# Patient Record
Sex: Female | Born: 1970 | Race: White | Hispanic: No | Marital: Married | Smoking: Never smoker
Health system: Southern US, Community
[De-identification: ages and names within clinical notes are randomized; demographics above are authoritative.]

## PROBLEM LIST (undated history)

## (undated) DIAGNOSIS — R519 Headache, unspecified: Secondary | ICD-10-CM

## (undated) DIAGNOSIS — G40909 Epilepsy, unspecified, not intractable, without status epilepticus: Secondary | ICD-10-CM

## (undated) HISTORY — PX: TOE SURGERY: SHX1073

## (undated) HISTORY — PX: APPENDECTOMY: SHX54

## (undated) HISTORY — PX: MOLE REMOVAL: SHX2046

## (undated) HISTORY — PX: ABDOMINAL HYSTERECTOMY: SHX81

## (undated) HISTORY — PX: BREAST BIOPSY: SHX20

---

## 2000-01-11 ENCOUNTER — Encounter: Payer: Self-pay | Admitting: Emergency Medicine

## 2000-01-11 ENCOUNTER — Emergency Department (HOSPITAL_COMMUNITY): Admission: EM | Admit: 2000-01-11 | Discharge: 2000-01-11 | Payer: Self-pay | Admitting: Emergency Medicine

## 2000-03-13 ENCOUNTER — Other Ambulatory Visit: Admission: RE | Admit: 2000-03-13 | Discharge: 2000-03-13 | Payer: Self-pay | Admitting: *Deleted

## 2000-03-13 ENCOUNTER — Encounter: Admission: RE | Admit: 2000-03-13 | Discharge: 2000-03-13 | Payer: Self-pay | Admitting: *Deleted

## 2000-03-13 ENCOUNTER — Encounter: Payer: Self-pay | Admitting: *Deleted

## 2005-01-15 ENCOUNTER — Observation Stay (HOSPITAL_COMMUNITY): Admission: AD | Admit: 2005-01-15 | Discharge: 2005-01-16 | Payer: Self-pay | Admitting: Gynecology

## 2007-07-06 ENCOUNTER — Ambulatory Visit: Payer: Self-pay | Admitting: Family Medicine

## 2007-08-22 ENCOUNTER — Ambulatory Visit: Payer: Self-pay | Admitting: Family Medicine

## 2009-05-07 ENCOUNTER — Emergency Department: Payer: Self-pay | Admitting: Emergency Medicine

## 2009-05-12 DIAGNOSIS — Z09 Encounter for follow-up examination after completed treatment for conditions other than malignant neoplasm: Secondary | ICD-10-CM | POA: Insufficient documentation

## 2009-05-19 DIAGNOSIS — Z0000 Encounter for general adult medical examination without abnormal findings: Secondary | ICD-10-CM | POA: Insufficient documentation

## 2010-03-25 ENCOUNTER — Emergency Department: Payer: Self-pay | Admitting: Emergency Medicine

## 2011-09-19 DIAGNOSIS — G40219 Localization-related (focal) (partial) symptomatic epilepsy and epileptic syndromes with complex partial seizures, intractable, without status epilepticus: Secondary | ICD-10-CM | POA: Insufficient documentation

## 2013-01-11 DIAGNOSIS — R928 Other abnormal and inconclusive findings on diagnostic imaging of breast: Secondary | ICD-10-CM | POA: Insufficient documentation

## 2014-07-24 DIAGNOSIS — D251 Intramural leiomyoma of uterus: Secondary | ICD-10-CM | POA: Insufficient documentation

## 2017-10-07 DIAGNOSIS — R030 Elevated blood-pressure reading, without diagnosis of hypertension: Secondary | ICD-10-CM | POA: Insufficient documentation

## 2018-07-15 ENCOUNTER — Other Ambulatory Visit: Payer: Self-pay | Admitting: Student

## 2018-07-15 ENCOUNTER — Other Ambulatory Visit (HOSPITAL_COMMUNITY): Payer: Self-pay | Admitting: Student

## 2018-07-15 DIAGNOSIS — G8929 Other chronic pain: Secondary | ICD-10-CM

## 2018-07-15 DIAGNOSIS — M25562 Pain in left knee: Secondary | ICD-10-CM

## 2018-07-17 ENCOUNTER — Ambulatory Visit
Admission: RE | Admit: 2018-07-17 | Discharge: 2018-07-17 | Disposition: A | Discharge status: Home / Self Care | Payer: BC Managed Care – PPO | Source: Ambulatory Visit | Attending: Student | Admitting: Student

## 2018-07-17 ENCOUNTER — Other Ambulatory Visit: Payer: Self-pay

## 2018-07-17 DIAGNOSIS — M25562 Pain in left knee: Secondary | ICD-10-CM | POA: Insufficient documentation

## 2018-07-17 DIAGNOSIS — G8929 Other chronic pain: Secondary | ICD-10-CM | POA: Diagnosis present

## 2018-11-05 ENCOUNTER — Other Ambulatory Visit: Payer: Self-pay

## 2018-11-05 ENCOUNTER — Encounter
Admission: RE | Admit: 2018-11-05 | Discharge: 2018-11-05 | Disposition: A | Discharge status: Home / Self Care | Payer: BC Managed Care – PPO | Source: Ambulatory Visit | Attending: Obstetrics & Gynecology | Admitting: Obstetrics & Gynecology

## 2018-11-05 HISTORY — DX: Epilepsy, unspecified, not intractable, without status epilepticus: G40.909

## 2018-11-05 NOTE — Patient Instructions (Addendum)
Your procedure is scheduled on: Friday 11/13/18.  Report to DAY SURGERY DEPARTMENT LOCATED ON 2ND FLOOR MEDICAL MALL ENTRANCE. To find out your arrival time please call [phone] between 1PM - 3PM on Thursday 11/12/18.    Remember: Instructions that are not followed completely may result in serious medical risk, up to and including death, or upon the discretion of your surgeon and anesthesiologist your surgery may need to be rescheduled.       _X__ 1. Do not eat food after midnight the night before your procedure.                 No gum chewing or hard candies. You may drink clear liquids up to 2 hours                 before you are scheduled to arrive for your surgery- DO NOT drink clear                 liquids within 2 hours of the start of your surgery.                 Clear Liquids include:  water, apple juice without pulp, clear carbohydrate                 drink such as Clearfast or Gatorade, Black Coffee or Tea (Do not add                 milk or creamer to coffee or tea).   **Dr. Leonides Schanz would like for you to complete the Ensure Pre-Surgery drink 2 hours prior to your arrival time on the morning of your surgery.**     __X__2.  On the morning of surgery brush your teeth with toothpaste and water, you may rinse your mouth with mouthwash if you wish.  Do not swallow any toothpaste or mouthwash.      __X__3.  Notify your doctor if there is any change in your medical condition      (cold, fever, infections).       Do not wear jewelry, make-up, hairpins, clips or nail polish. Do not wear lotions, powders, or perfumes.  Do not shave 48 hours prior to surgery. Men may shave face and neck. Do not bring valuables to the hospital.      [facility] is not responsible for any belongings or valuables.    Contacts, dentures/partials or body piercings may not be worn into surgery. Bring a case for your contacts, glasses or hearing aids, a denture cup will be  supplied.     Patients discharged the day of surgery will not be allowed to drive home.     Please read over the following fact sheets that you were given:   MRSA Information    __X__ Take these medicines the morning of surgery with A SIP OF WATER:     1. amitriptyline (ELAVIL) 25 MG tablet  2. CARBAMAZEPINE ER PO  3. levETIRAcetam (KEPPRA) 1000 MG tablet     __X__ Use CHG Soap as directed    __X__ Stop Anti-inflammatories 7 days before surgery such as Advil, Ibuprofen, Motrin, BC or Goodies Powder, Naprosyn, Naproxen, Aleve, Aspirin, Meloxicam. May take Tylenol if needed for pain or discomfort.     __X__ Don't start taking any new herbal supplements before surgery.

## 2018-11-10 ENCOUNTER — Other Ambulatory Visit
Admission: RE | Admit: 2018-11-10 | Discharge: 2018-11-10 | Disposition: A | Discharge status: Home / Self Care | Payer: BC Managed Care – PPO | Source: Ambulatory Visit | Attending: Obstetrics & Gynecology | Admitting: Obstetrics & Gynecology

## 2018-11-10 ENCOUNTER — Other Ambulatory Visit: Payer: Self-pay

## 2018-11-10 DIAGNOSIS — Z20828 Contact with and (suspected) exposure to other viral communicable diseases: Secondary | ICD-10-CM | POA: Diagnosis not present

## 2018-11-10 DIAGNOSIS — Z01812 Encounter for preprocedural laboratory examination: Secondary | ICD-10-CM | POA: Diagnosis not present

## 2018-11-10 LAB — TYPE AND SCREEN
ABO/RH(D): A POS
Antibody Screen: NEGATIVE

## 2018-11-10 LAB — CBC
HCT: 38.9 % (ref 36.0–46.0)
Hemoglobin: 13 g/dL (ref 12.0–15.0)
MCH: 33 pg (ref 26.0–34.0)
MCHC: 33.4 g/dL (ref 30.0–36.0)
MCV: 98.7 fL (ref 80.0–100.0)
Platelets: 223 10*3/uL (ref 150–400)
RBC: 3.94 MIL/uL (ref 3.87–5.11)
RDW: 12.9 % (ref 11.5–15.5)
WBC: 3.2 10*3/uL — ABNORMAL LOW (ref 4.0–10.5)
nRBC: 0 % (ref 0.0–0.2)

## 2018-11-10 LAB — BASIC METABOLIC PANEL
Anion gap: 8 (ref 5–15)
BUN: 9 mg/dL (ref 6–20)
CO2: 24 mmol/L (ref 22–32)
Calcium: 8.9 mg/dL (ref 8.9–10.3)
Chloride: 107 mmol/L (ref 98–111)
Creatinine, Ser: 0.55 mg/dL (ref 0.44–1.00)
GFR calc Af Amer: >60 mL/min (ref >60)
GFR calc non Af Amer: >60 mL/min (ref >60)
Glucose, Bld: 97 mg/dL (ref 70–99)
Potassium: 4.4 mmol/L (ref 3.5–5.1)
Sodium: 139 mmol/L (ref 135–145)

## 2018-11-10 LAB — SARS CORONAVIRUS 2 (TAT 6-24 HRS): SARS Coronavirus 2: NEGATIVE

## 2018-11-13 ENCOUNTER — Ambulatory Visit: Payer: BC Managed Care – PPO | Admitting: Anesthesiology

## 2018-11-13 ENCOUNTER — Ambulatory Visit
Admission: RE | Admit: 2018-11-13 | Discharge: 2018-11-13 | Disposition: A | Discharge status: Home / Self Care | Payer: BC Managed Care – PPO | Attending: Obstetrics & Gynecology | Admitting: Obstetrics & Gynecology

## 2018-11-13 ENCOUNTER — Encounter
Admission: RE | Disposition: A | Discharge status: Home / Self Care | Payer: Self-pay | Source: Home / Self Care | Attending: Obstetrics & Gynecology

## 2018-11-13 ENCOUNTER — Other Ambulatory Visit: Payer: Self-pay

## 2018-11-13 ENCOUNTER — Encounter: Payer: Self-pay | Admitting: *Deleted

## 2018-11-13 DIAGNOSIS — D259 Leiomyoma of uterus, unspecified: Secondary | ICD-10-CM | POA: Insufficient documentation

## 2018-11-13 DIAGNOSIS — K660 Peritoneal adhesions (postprocedural) (postinfection): Secondary | ICD-10-CM | POA: Insufficient documentation

## 2018-11-13 DIAGNOSIS — G40909 Epilepsy, unspecified, not intractable, without status epilepticus: Secondary | ICD-10-CM | POA: Insufficient documentation

## 2018-11-13 DIAGNOSIS — N841 Polyp of cervix uteri: Secondary | ICD-10-CM | POA: Insufficient documentation

## 2018-11-13 DIAGNOSIS — N800 Endometriosis of uterus: Secondary | ICD-10-CM | POA: Diagnosis not present

## 2018-11-13 DIAGNOSIS — N946 Dysmenorrhea, unspecified: Secondary | ICD-10-CM | POA: Diagnosis not present

## 2018-11-13 DIAGNOSIS — R102 Pelvic and perineal pain: Secondary | ICD-10-CM | POA: Diagnosis not present

## 2018-11-13 DIAGNOSIS — Z79899 Other long term (current) drug therapy: Secondary | ICD-10-CM | POA: Diagnosis not present

## 2018-11-13 HISTORY — PX: ROBOTIC ASSISTED TOTAL HYSTERECTOMY WITH BILATERAL SALPINGO OOPHERECTOMY: SHX6086

## 2018-11-13 HISTORY — PX: LAPAROSCOPIC LYSIS OF ADHESIONS: SHX5905

## 2018-11-13 LAB — POCT PREGNANCY, URINE: Preg Test, Ur: NEGATIVE

## 2018-11-13 LAB — ABO/RH: ABO/RH(D): A POS

## 2018-11-13 SURGERY — HYSTERECTOMY, TOTAL, ROBOT-ASSISTED, LAPAROSCOPIC, WITH BILATERAL SALPINGO-OOPHORECTOMY
Anesthesia: General | Laterality: Bilateral

## 2018-11-13 MED ORDER — SODIUM CHLORIDE FLUSH 0.9 % IV SOLN
INTRAVENOUS | Status: AC
  Filled 2018-11-13: qty 10

## 2018-11-13 MED ORDER — DEXAMETHASONE SODIUM PHOSPHATE 10 MG/ML IJ SOLN
INTRAMUSCULAR | Status: AC
  Administered 2018-11-13: 07:00:00 4 mg via INTRAVENOUS
  Filled 2018-11-13: qty 1

## 2018-11-13 MED ORDER — FENTANYL CITRATE (PF) 100 MCG/2ML IJ SOLN
INTRAMUSCULAR | Status: AC
  Administered 2018-11-13: 15:00:00 25 ug via INTRAVENOUS
  Filled 2018-11-13: qty 2

## 2018-11-13 MED ORDER — DEXAMETHASONE SODIUM PHOSPHATE 10 MG/ML IJ SOLN
INTRAMUSCULAR | Status: AC
  Filled 2018-11-13: qty 1

## 2018-11-13 MED ORDER — ACETAMINOPHEN 500 MG PO TABS
1000.0000 mg | ORAL_TABLET | ORAL | Status: AC
  Administered 2018-11-13: 07:00:00 1000 mg via ORAL

## 2018-11-13 MED ORDER — PHENYLEPHRINE HCL (PRESSORS) 10 MG/ML IV SOLN
INTRAVENOUS | Status: AC
  Filled 2018-11-13: qty 1

## 2018-11-13 MED ORDER — ROCURONIUM BROMIDE 100 MG/10ML IV SOLN
INTRAVENOUS | Status: DC | PRN
  Administered 2018-11-13 (×3): 10 mg via INTRAVENOUS
  Administered 2018-11-13: 15 mg via INTRAVENOUS
  Administered 2018-11-13 (×2): 10 mg via INTRAVENOUS
  Administered 2018-11-13: 50 mg via INTRAVENOUS
  Administered 2018-11-13 (×2): 10 mg via INTRAVENOUS

## 2018-11-13 MED ORDER — DEXAMETHASONE SODIUM PHOSPHATE 10 MG/ML IJ SOLN
INTRAMUSCULAR | Status: DC | PRN
  Administered 2018-11-13: 5 mg via INTRAVENOUS

## 2018-11-13 MED ORDER — IBUPROFEN 800 MG PO TABS: 800.0000 mg | ORAL_TABLET | Freq: Four times a day (QID) | ORAL | 0 refills | Status: DC

## 2018-11-13 MED ORDER — OXYCODONE HCL 5 MG PO TABS
ORAL_TABLET | ORAL | Status: AC
  Filled 2018-11-13: qty 1

## 2018-11-13 MED ORDER — HEPARIN SODIUM (PORCINE) 5000 UNIT/ML IJ SOLN
5000.0000 [IU] | INTRAMUSCULAR | Status: AC
  Administered 2018-11-13: 07:00:00 5000 [IU] via SUBCUTANEOUS

## 2018-11-13 MED ORDER — SUGAMMADEX SODIUM 200 MG/2ML IV SOLN
INTRAVENOUS | Status: AC
  Filled 2018-11-13: qty 2

## 2018-11-13 MED ORDER — GLYCOPYRROLATE 0.2 MG/ML IJ SOLN
INTRAMUSCULAR | Status: AC
  Filled 2018-11-13: qty 1

## 2018-11-13 MED ORDER — MIDAZOLAM HCL 2 MG/2ML IJ SOLN
INTRAMUSCULAR | Status: AC
  Filled 2018-11-13: qty 2

## 2018-11-13 MED ORDER — ROCURONIUM BROMIDE 50 MG/5ML IV SOLN
INTRAVENOUS | Status: AC
  Filled 2018-11-13: qty 1

## 2018-11-13 MED ORDER — FENTANYL CITRATE (PF) 100 MCG/2ML IJ SOLN
25.0000 ug | INTRAMUSCULAR | Status: DC | PRN
  Administered 2018-11-13 (×4): 25 ug via INTRAVENOUS

## 2018-11-13 MED ORDER — PROPOFOL 10 MG/ML IV BOLUS
INTRAVENOUS | Status: DC | PRN
  Administered 2018-11-13: 150 mg via INTRAVENOUS

## 2018-11-13 MED ORDER — OXYCODONE HCL 5 MG/5ML PO SOLN: 5.0000 mg | Freq: Once | ORAL | Status: AC | PRN

## 2018-11-13 MED ORDER — SCOPOLAMINE 1 MG/3DAYS TD PT72
MEDICATED_PATCH | TRANSDERMAL | Status: AC
  Administered 2018-11-13: 1.5 mg via TRANSDERMAL
  Filled 2018-11-13: qty 1

## 2018-11-13 MED ORDER — SEVOFLURANE IN SOLN
RESPIRATORY_TRACT | Status: AC
  Filled 2018-11-13: qty 250

## 2018-11-13 MED ORDER — PHENYLEPHRINE HCL (PRESSORS) 10 MG/ML IV SOLN
INTRAVENOUS | Status: DC | PRN
  Administered 2018-11-13: 100 ug via INTRAVENOUS
  Administered 2018-11-13: 150 ug via INTRAVENOUS
  Administered 2018-11-13 (×3): 100 ug via INTRAVENOUS

## 2018-11-13 MED ORDER — FAMOTIDINE 20 MG PO TABS
20.0000 mg | ORAL_TABLET | Freq: Once | ORAL | Status: AC
  Administered 2018-11-13: 07:00:00 20 mg via ORAL

## 2018-11-13 MED ORDER — EPHEDRINE SULFATE 50 MG/ML IJ SOLN
INTRAMUSCULAR | Status: AC
  Filled 2018-11-13: qty 1

## 2018-11-13 MED ORDER — KETOROLAC TROMETHAMINE 30 MG/ML IJ SOLN
INTRAMUSCULAR | Status: DC | PRN
  Administered 2018-11-13: 15 mg via INTRAVENOUS

## 2018-11-13 MED ORDER — PROPOFOL 10 MG/ML IV BOLUS
INTRAVENOUS | Status: AC
  Filled 2018-11-13: qty 20

## 2018-11-13 MED ORDER — CELECOXIB 200 MG PO CAPS
ORAL_CAPSULE | ORAL | Status: AC
  Administered 2018-11-13: 07:00:00 400 mg via ORAL
  Filled 2018-11-13: qty 2

## 2018-11-13 MED ORDER — FENTANYL CITRATE (PF) 100 MCG/2ML IJ SOLN
INTRAMUSCULAR | Status: DC | PRN
  Administered 2018-11-13 (×2): 25 ug via INTRAVENOUS
  Administered 2018-11-13: 100 ug via INTRAVENOUS
  Administered 2018-11-13 (×4): 25 ug via INTRAVENOUS

## 2018-11-13 MED ORDER — ENSURE PRE-SURGERY PO LIQD
296.0000 mL | Freq: Once | ORAL | Status: DC
  Filled 2018-11-13: qty 296

## 2018-11-13 MED ORDER — OXYCODONE HCL 5 MG PO TABS: 5.0000 mg | ORAL_TABLET | ORAL | 0 refills | Status: AC | PRN

## 2018-11-13 MED ORDER — ONDANSETRON HCL 4 MG/2ML IJ SOLN
INTRAMUSCULAR | Status: DC | PRN
  Administered 2018-11-13: 4 mg via INTRAVENOUS

## 2018-11-13 MED ORDER — ATROPINE SULFATE 0.4 MG/ML IJ SOLN
INTRAMUSCULAR | Status: AC
  Filled 2018-11-13: qty 1

## 2018-11-13 MED ORDER — ACETAMINOPHEN 500 MG PO TABS
ORAL_TABLET | ORAL | Status: AC
  Administered 2018-11-13: 1000 mg via ORAL
  Filled 2018-11-13: qty 2

## 2018-11-13 MED ORDER — LIDOCAINE HCL (CARDIAC) PF 100 MG/5ML IV SOSY
PREFILLED_SYRINGE | INTRAVENOUS | Status: DC | PRN
  Administered 2018-11-13: 100 mg via INTRAVENOUS

## 2018-11-13 MED ORDER — LIDOCAINE HCL (PF) 2 % IJ SOLN
INTRAMUSCULAR | Status: AC
  Filled 2018-11-13: qty 10

## 2018-11-13 MED ORDER — MIDAZOLAM HCL 2 MG/2ML IJ SOLN
INTRAMUSCULAR | Status: DC | PRN
  Administered 2018-11-13: 2 mg via INTRAVENOUS

## 2018-11-13 MED ORDER — HEPARIN SODIUM (PORCINE) 5000 UNIT/ML IJ SOLN
INTRAMUSCULAR | Status: AC
  Administered 2018-11-13: 5000 [IU] via SUBCUTANEOUS
  Filled 2018-11-13: qty 1

## 2018-11-13 MED ORDER — SUGAMMADEX SODIUM 200 MG/2ML IV SOLN
INTRAVENOUS | Status: DC | PRN
  Administered 2018-11-13: 100 mg via INTRAVENOUS

## 2018-11-13 MED ORDER — OXYCODONE HCL 5 MG PO TABS
5.0000 mg | ORAL_TABLET | Freq: Once | ORAL | Status: AC | PRN
  Administered 2018-11-13: 5 mg via ORAL

## 2018-11-13 MED ORDER — CEFAZOLIN SODIUM-DEXTROSE 2-4 GM/100ML-% IV SOLN
INTRAVENOUS | Status: AC
  Filled 2018-11-13: qty 100

## 2018-11-13 MED ORDER — CEFAZOLIN SODIUM-DEXTROSE 2-4 GM/100ML-% IV SOLN
2.0000 g | INTRAVENOUS | Status: AC
  Administered 2018-11-13 (×2): 2 g via INTRAVENOUS

## 2018-11-13 MED ORDER — CELECOXIB 200 MG PO CAPS
400.0000 mg | ORAL_CAPSULE | ORAL | Status: AC
  Administered 2018-11-13: 07:00:00 400 mg via ORAL

## 2018-11-13 MED ORDER — ONDANSETRON HCL 4 MG/2ML IJ SOLN
INTRAMUSCULAR | Status: AC
  Filled 2018-11-13: qty 2

## 2018-11-13 MED ORDER — LACTATED RINGERS IV SOLN
INTRAVENOUS | Status: DC
  Administered 2018-11-13: 07:00:00 via INTRAVENOUS

## 2018-11-13 MED ORDER — SUCCINYLCHOLINE CHLORIDE 20 MG/ML IJ SOLN
INTRAMUSCULAR | Status: AC
  Filled 2018-11-13: qty 1

## 2018-11-13 MED ORDER — FAMOTIDINE 20 MG PO TABS
ORAL_TABLET | ORAL | Status: AC
  Administered 2018-11-13: 20 mg via ORAL
  Filled 2018-11-13: qty 1

## 2018-11-13 MED ORDER — DEXAMETHASONE SODIUM PHOSPHATE 10 MG/ML IJ SOLN
4.0000 mg | INTRAMUSCULAR | Status: AC
  Administered 2018-11-13: 07:00:00 4 mg via INTRAVENOUS

## 2018-11-13 MED ORDER — SCOPOLAMINE 1 MG/3DAYS TD PT72
1.0000 | MEDICATED_PATCH | TRANSDERMAL | Status: DC
  Administered 2018-11-13: 07:00:00 1.5 mg via TRANSDERMAL

## 2018-11-13 MED ORDER — GABAPENTIN 300 MG PO CAPS
ORAL_CAPSULE | ORAL | Status: AC
  Administered 2018-11-13: 600 mg via ORAL
  Filled 2018-11-13: qty 2

## 2018-11-13 MED ORDER — FENTANYL CITRATE (PF) 250 MCG/5ML IJ SOLN
INTRAMUSCULAR | Status: AC
  Filled 2018-11-13: qty 5

## 2018-11-13 MED ORDER — GABAPENTIN 300 MG PO CAPS
600.0000 mg | ORAL_CAPSULE | ORAL | Status: AC
  Administered 2018-11-13: 07:00:00 600 mg via ORAL

## 2018-11-13 MED ORDER — GLYCOPYRROLATE 0.2 MG/ML IJ SOLN
INTRAMUSCULAR | Status: DC | PRN
  Administered 2018-11-13: 0.2 mg via INTRAVENOUS

## 2018-11-13 SURGICAL SUPPLY — 86 items
BAG URINE DRAINAGE (UROLOGICAL SUPPLIES) ×4 IMPLANT
BARRIER ADHS 3X4 INTERCEED (GAUZE/BANDAGES/DRESSINGS) ×6 IMPLANT
BLADE SURG SZ11 CARB STEEL (BLADE) ×4 IMPLANT
CANISTER SUCT 1200ML W/VALVE (MISCELLANEOUS) ×4 IMPLANT
CATH FOLEY 2WAY  5CC 16FR (CATHETERS) ×2
CATH URTH 16FR FL 2W BLN LF (CATHETERS) ×2 IMPLANT
CHLORAPREP W/TINT 26 (MISCELLANEOUS) ×4 IMPLANT
CORD BIP STRL DISP 12FT (MISCELLANEOUS) IMPLANT
COVER TIP SHEARS 8 DVNC (MISCELLANEOUS) ×2 IMPLANT
COVER TIP SHEARS 8MM DA VINCI (MISCELLANEOUS) ×2
COVER WAND RF STERILE (DRAPES) ×4 IMPLANT
CRADLE LAMINECT ARM (MISCELLANEOUS) ×4 IMPLANT
DEFOGGER SCOPE WARMER CLEARIFY (MISCELLANEOUS) ×4 IMPLANT
DERMABOND ADVANCED (GAUZE/BANDAGES/DRESSINGS) ×2
DERMABOND ADVANCED .7 DNX12 (GAUZE/BANDAGES/DRESSINGS) ×2 IMPLANT
DRAPE 3/4 80X56 (DRAPES) ×12 IMPLANT
DRAPE ARM DVNC X/XI (DISPOSABLE) ×8 IMPLANT
DRAPE COLUMN DVNC XI (DISPOSABLE) ×2 IMPLANT
DRAPE DA VINCI XI ARM (DISPOSABLE) ×8
DRAPE DA VINCI XI COLUMN (DISPOSABLE) ×2
DRAPE LEGGINS SURG 28X43 STRL (DRAPES) ×4 IMPLANT
DRAPE UNDER BUTTOCK W/FLU (DRAPES) ×4 IMPLANT
DRIVER NDL 23- D MEGA 49.7X1.4 (INSTRUMENTS) ×2 IMPLANT
DRIVER NDL LRG 8 DVNC XI (INSTRUMENTS) ×2 IMPLANT
DRIVER NDLE LRG 8 DVNC XI (INSTRUMENTS) IMPLANT
DRIVER NEEDLE XI 8MM LRG DVNC (INSTRUMENTS)
DRVR NDL 23- D MEGA 49.7X1.4 (INSTRUMENTS) ×2
ELECT REM PT RETURN 9FT ADLT (ELECTROSURGICAL) ×4
ELECTRODE REM PT RTRN 9FT ADLT (ELECTROSURGICAL) ×2 IMPLANT
FILTER LAP SMOKE EVAC STRL (MISCELLANEOUS) ×4 IMPLANT
GLOVE PROTEXIS LATEX SZ 7.5 (GLOVE) ×28 IMPLANT
GLOVE SURG LATEX 7.5 PF (GLOVE) IMPLANT
GLOVE SURG SYN 6.5 ES PF (GLOVE) ×32 IMPLANT
GLOVE SURG SYN 6.5 PF PI (GLOVE) ×16 IMPLANT
GOWN STRL REUS W/ TWL LRG LVL3 (GOWN DISPOSABLE) ×16 IMPLANT
GOWN STRL REUS W/TWL LRG LVL3 (GOWN DISPOSABLE) ×16
GRASPER SUT TROCAR 14GX15 (MISCELLANEOUS) ×2 IMPLANT
IRRIGATION STRYKERFLOW (MISCELLANEOUS) IMPLANT
IRRIGATOR STRYKERFLOW (MISCELLANEOUS)
IV NS 1000ML (IV SOLUTION)
IV NS 1000ML BAXH (IV SOLUTION) IMPLANT
KIT PINK PAD W/HEAD ARE REST (MISCELLANEOUS) ×4
KIT PINK PAD W/HEAD ARM REST (MISCELLANEOUS) ×2 IMPLANT
KIT TURNOVER CYSTO (KITS) ×4 IMPLANT
L-HOOK LAP DISP 36CM (ELECTROSURGICAL) ×4
LABEL OR SOLS (LABEL) ×4 IMPLANT
LHOOK LAP DISP 36CM (ELECTROSURGICAL) IMPLANT
MANIPULATOR VCARE LG CRV RETR (MISCELLANEOUS) IMPLANT
MANIPULATOR VCARE SML CRV RETR (MISCELLANEOUS) IMPLANT
MANIPULATOR VCARE STD CRV RETR (MISCELLANEOUS) ×2 IMPLANT
NDL INSUFF 14G 150MM VS150000 (NEEDLE) ×2 IMPLANT
NEEDLE HYPO 22GX1.5 SAFETY (NEEDLE) ×4 IMPLANT
NEEDLE VERESS 14GA 120MM (NEEDLE) ×2 IMPLANT
NS IRRIG 1000ML POUR BTL (IV SOLUTION) ×4 IMPLANT
OBTURATOR OPTICAL STANDARD 8MM (TROCAR) ×2
OBTURATOR OPTICAL STND 8 DVNC (TROCAR) ×2
OBTURATOR OPTICALSTD 8 DVNC (TROCAR) ×2 IMPLANT
PACK LAP CHOLECYSTECTOMY (MISCELLANEOUS) ×4 IMPLANT
PAD OB MATERNITY 4.3X12.25 (PERSONAL CARE ITEMS) ×4 IMPLANT
PAD PREP 24X41 OB/GYN DISP (PERSONAL CARE ITEMS) ×4 IMPLANT
PENCIL ELECTRO HAND CTR (MISCELLANEOUS) ×4 IMPLANT
SEAL CANN UNIV 5-8 DVNC XI (MISCELLANEOUS) ×6 IMPLANT
SEAL XI 5MM-8MM UNIVERSAL (MISCELLANEOUS) ×6
SEALER VESSEL DA VINCI XI (MISCELLANEOUS) ×2
SEALER VESSEL EXT DVNC XI (MISCELLANEOUS) IMPLANT
SET CYSTO W/LG BORE CLAMP LF (SET/KITS/TRAYS/PACK) IMPLANT
SOLUTION ELECTROLUBE (MISCELLANEOUS) ×4 IMPLANT
SUT CUT NEEDLE DRIVER (INSTRUMENTS) ×2
SUT DVC VLOC 180 0 12IN GS21 (SUTURE)
SUT ETHIBOND NAB CT1 #1 30IN (SUTURE) IMPLANT
SUT MNCRL 4-0 (SUTURE) ×2
SUT MNCRL 4-0 27XMFL (SUTURE) ×2
SUT VIC AB 0 CT1 36 (SUTURE) ×6 IMPLANT
SUT VIC AB 0 UR5 27 (SUTURE) ×4 IMPLANT
SUT VIC AB 1 CT1 36 (SUTURE) ×6 IMPLANT
SUT VIC AB 2-0 CT1 27 (SUTURE)
SUT VIC AB 2-0 CT1 TAPERPNT 27 (SUTURE) ×2 IMPLANT
SUT VIC AB 3-0 SH 27 (SUTURE)
SUT VIC AB 3-0 SH 27X BRD (SUTURE) ×4 IMPLANT
SUT VICRYL 0 AB UR-6 (SUTURE) ×4 IMPLANT
SUTURE DVC VLC 180 0 12IN GS21 (SUTURE) IMPLANT
SUTURE MNCRL 4-0 27XMF (SUTURE) ×2 IMPLANT
SYR 10ML LL (SYRINGE) ×4 IMPLANT
TROCAR ENDO BLADELESS 11MM (ENDOMECHANICALS) ×2 IMPLANT
TROCAR XCEL NON-BLD 5MMX100MML (ENDOMECHANICALS) ×2 IMPLANT
TUBING EVAC SMOKE HEATED PNEUM (TUBING) ×2 IMPLANT

## 2018-11-13 NOTE — OR Nursing (Signed)
Spoke with Dr Leonides Schanz regarding patients complaints of pressure in rectum. Complaints of pressure explained by Dr Leonides Schanz and passed on to patient. All questions answered.

## 2018-11-13 NOTE — Anesthesia Procedure Notes (Signed)
Procedure Name: Intubation Date/Time: 11/13/2018 7:47 AM Performed by: Zetta Bills, CRNA Pre-anesthesia Checklist: Patient identified, Emergency Drugs available, Suction available and Patient being monitored Patient Re-evaluated:Patient Re-evaluated prior to induction Oxygen Delivery Method: Circle system utilized Preoxygenation: Pre-oxygenation with 100% oxygen Induction Type: IV induction Ventilation: Mask ventilation without difficulty Laryngoscope Size: Mac and 3 Grade View: Grade I Tube type: Oral Tube size: 7.0 mm Number of attempts: 1 Airway Equipment and Method: Stylet Placement Confirmation: ETT inserted through vocal cords under direct vision,  positive ETCO2 and breath sounds checked- equal and bilateral Secured at: 20 cm Tube secured with: Tape Dental Injury: Teeth and Oropharynx as per pre-operative assessment

## 2018-11-13 NOTE — Anesthesia Post-op Follow-up Note (Signed)
Anesthesia QCDR form completed.        

## 2018-11-13 NOTE — Op Note (Addendum)
11/13/2018  PATIENT:  Carolyn Cuevas  48 y.o. female  PRE-OPERATIVE DIAGNOSIS:  Dysmenorrhea, pelvic pain  POST-OPERATIVE DIAGNOSIS:  Dysmenorrhea, pelvic pain, STAGE 4 Endometriosis, extensive pelvic and abdominal adhesive disease.  PROCEDURE:  Procedure(s): XI ROBOTIC ASSISTED TYPE 5 RADICAL HYSTERECTOMY WITH BILATERAL SALPINGECTOMY EXTENSIVE LAPAROSCOPIC LYSIS OF ADHESIONS, PERITONEAL STRIPPING  SURGEON:  Surgeon(s) and Role:    * Ward, Chelsea C, MD - Primary  ANESTHESIA: GET  EBL:  Total I/O In: 1800 [I.V.:1800] Out: 660 [Urine:600; Blood:60]  DRAINS: foley to gravity, removed in OR postop  SPECIMEN: Uterus, Cervix, bilateral tubes  DISPOSITION OF SPECIMEN:  To pathology  COUNTS: correct x2  COMPLICATIONS: none apparent  PATIENT DISPOSITION:  VS stable to PACU  FINDINGS: 1. 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  2. Filmy and dense adhesions across ascending and descending colon, altering transit course and mobility of these portions of the colon, as well as obscuring areas for designated port site entry. 3. Obliterated posterior culdesac 4. Several hemosiderin brown and black endometriosis deposits.  Some endometriomas created by scarred peritoneum (inclusion cyst) in both ovarian fossas, and other areas posterior pelvis 5. Bilateral tubes scarred to ovary and ovarian fossa  6. Dilated pelvic vessels on right 7. Normal bilateral ovaries 8. Patent rectum, prior to and post release of scar tissue between rectum/sigmoid and uterus in the posterior culdesac.  Indication for surgery: Patient had presented with complaints of dysmenorrhea, dysparenunia.  Various treatment options were discussed and patient requested a hysterectomy to resolve her symptoms. Risks benefits and alternatives were reviewed and informed consent was obtained.   Procedure: The patient was brought to the OR and identified as Carolyn Cuevas.  She was given general  anesthesia via endotracheal route.  Nasogastric tube was placed.  She was then positioned in the dorsal lithotomy position and prepped and draped in the usual sterile fashion.  A surgical time-out was called. A foley catheter was placed.  A speculum was placed in the vagina and the cervix was visualized, grasped with a single tooth tenaculum and the V-Care uterine manipulator was placed in and around the cervix.  After a change of gloves, the attention was turned to the abdomen.  A midline incision was made in the umbilicus.  The subcutaneous tissues were dissected, the fascia was divided, the peritoneum entered, and a robotic trochar was inserted.  Pneumoperitoneum was created to 61mmHg.  One Robotic trochar was inserted and bovie hook was used to dissect away the descending bowel on the left side so the remaining two trochars could be inserted atraumatically under visualization.  The patient was placed in steep trendelenburg, and the daVinci robot was docked and a cautery hook, bipolar fenestrated grasper, and vessel sealer were employed.  A brief survey of the upper abdomen was performed. Due to the extensive scarring of the cecum and surrounding small and large bowel, at least an hour of dissection was spent in this area, which was necessary to visualize the surgical field.  Due to the extensive scarring in the pelvis, a retroperitoneal approach was necessary.  The entire procedure was then performed as a radical dissection. The bilateral round ligaments were cauterized and transected.  The anterior peritoneum was covered in brown hemosiderin deposits, and thus the peritoneum was stripped between the bilateral round ligaments, and anteriorly to the dome of the bladder.  Each fallopian tube was grasped and mesosalpinx divided along the ovaries, and the utero-ovarian ligaments/vessels were divided.  Each adnexa was reflected  medially and the ureters were visualized, the internal iliac arteries were identified  and the uterine branches skeletonized at their origin.  These were traced to the intersection of the ureter, and ureterolysis was performed bilaterally  to ensure that the vessels integrity was maintained.  The uterine arteries were both cauterized and transected laterally to their crossing of the ureters. These vessels were traced over the ureters and to the uterus. The uterus was anteverted and the dense scarring of the posterior culdesac was attempted to be divided.  Carefully from either lateral side the tissues were divided until what remained appeared to be bowel serosa.  A rectal sizer was placed into the rectum in order to visualized the path of the bowel.  Using this manipulator, the remainder of the bowel was freed by careful dissection of the posterior uterus.  Once mobile, the cardinal and uterosacral ligaments were divided and a colpotomy was created around the cervical cup.  The uterus, cervix, tubes and ovaries were removed through the vagina and handed off to nursing to be sent to pathology.  The instruments were changed to needle drivers, and the vaginal cuff was closed using 0-V-lock suture.  The cuff was tested for integrity.    The surgical field was covered in intercede hopefully to discourage scar formation.    The laparoscopic instruments were removed. The remaining trochars were removed and the midline fascia was closed using 0-Vicryl.  The skin of all incision were closed with 4-0 monocryl and covered with surgical glue.  The procedure was then deemed complete.    The sponge, needle, and instrument counts were correct x2.  The patient tolerated reversal of anesthesia, and was brought to the PACU in a stable condition.  I was present and performed this case in its entirety. Larey Days, MD Attending Obstetrician and Gynecologist [facility]  This case took 6.5 hours to complete.  Her scarring was extensive, and required careful  dissection.  Modifier 22.

## 2018-11-13 NOTE — Anesthesia Preprocedure Evaluation (Signed)
Anesthesia Evaluation  Patient identified by MRN, date of birth, ID band Patient awake    Reviewed: Allergy & Precautions, H&P , NPO status , Patient's Chart, lab work & pertinent test results  History of Anesthesia Complications Negative for: history of anesthetic complications  Airway Mallampati: III  TM Distance: >3 FB Neck ROM: full    Dental  (+) Chipped   Pulmonary neg pulmonary ROS, neg shortness of breath,           Cardiovascular Exercise Tolerance: Good (-) angina(-) Past MI and (-) DOE negative cardio ROS       Neuro/Psych Seizures -, Well Controlled,  negative psych ROS   GI/Hepatic negative GI ROS, Neg liver ROS, neg GERD  ,  Endo/Other  negative endocrine ROS  Renal/GU      Musculoskeletal   Abdominal   Peds  Hematology negative hematology ROS (+)   Anesthesia Other Findings Past Medical History: No date: Epilepsy [facility])  Past Surgical History: No date: APPENDECTOMY No date: TOE SURGERY; Right  BMI    Body Mass Index: 26.60 kg/m      Reproductive/Obstetrics negative OB ROS                             Anesthesia Physical Anesthesia Plan  ASA: III  Anesthesia Plan: General ETT   Post-op Pain Management:    Induction: Intravenous  PONV Risk Score and Plan: Ondansetron, Dexamethasone, Midazolam and Treatment may vary due to age or medical condition  Airway Management Planned: Oral ETT  Additional Equipment:   Intra-op Plan:   Post-operative Plan: Extubation in OR  Informed Consent: I have reviewed the patients History and Physical, chart, labs and discussed the procedure including the risks, benefits and alternatives for the proposed anesthesia with the patient or authorized representative who has indicated his/her understanding and acceptance.     Dental Advisory Given  Plan Discussed with: Anesthesiologist, CRNA and Surgeon  Anesthesia Plan  Comments: (Patient consented for risks of anesthesia including but not limited to:  - adverse reactions to medications - damage to teeth, lips or other oral mucosa - sore throat or hoarseness - Damage to heart, brain, lungs or loss of life  Patient voiced understanding.)        Anesthesia Quick Evaluation

## 2018-11-13 NOTE — Discharge Instructions (Signed)
Discharge instructions:  Call office if you have any of the following: fever >101 F, chills, shortness of breath, excessive vaginal bleeding, incision drainage or problems, leg pain or redness, or any other concerns.   Activity: Do not lift > 10 lbs for 8 weeks.  No intercourse or tampons for 8 weeks.  No driving for 1-2 weeks.   You may feel some pain in your upper right abdomen/rib and right shoulder.  This is from the gas in the abdomen for surgery. This will subside over time, please be patient!  Take 800mg  Ibuprofen and 1000mg  Tylenol -together- around the clock, every 6 hours for at least the first 3-5 days.  After this you can take as needed.  This will help decrease inflammation and promote healing.  The narcotics you'll take just as needed, as they just trick your brain into thinking its not in pain.    Please don't limit yourself in terms of routine activity.  You will be able to do most things, although they may take longer to do or be a little painful.  You can do it!  Don't be a hero, but don't be a wimp either!   AMBULATORY SURGERY  DISCHARGE INSTRUCTIONS   1) The drugs that you were given will stay in your system until tomorrow so for the next 24 hours you should not:  A) Drive an automobile B) Make any legal decisions C) Drink any alcoholic beverage   2) You may resume regular meals tomorrow.  Today it is better to start with liquids and gradually work up to solid foods.  You may eat anything you prefer, but it is better to start with liquids, then soup and crackers, and gradually work up to solid foods.   3) Please notify your doctor immediately if you have any unusual bleeding, trouble breathing, redness and pain at the surgery site, drainage, fever, or pain not relieved by medication.    4) Additional Instructions:        Please contact your physician with any problems or Same Day Surgery at [phone], Monday through Friday 6 am to 4 pm, or [place]  at [facility] number at [phone].

## 2018-11-13 NOTE — Transfer of Care (Signed)
Immediate Anesthesia Transfer of Care Note  Patient: Carolyn Cuevas  Procedure(s) Performed: XI ROBOTIC ASSISTED STAGE 5 RADICAL HYSTERECTOMY WITH BILATERAL SALPINGECTOMY (Bilateral ) EXTENSIVE LAPAROSCOPIC LYSIS OF ADHESIONS, PERITONEAL STRIPPING  Patient Location: PACU  Anesthesia Type:General  Level of Consciousness: awake  Airway & Oxygen Therapy: Patient Spontanous Breathing  Post-op Assessment: Report given to RN  Post vital signs: stable  Last Vitals:  Vitals Value Taken Time  BP 129/72 11/13/18 1423  Temp    Pulse 96 11/13/18 1428  Resp 21 11/13/18 1428  SpO2 100 % 11/13/18 1428  Vitals shown include unvalidated device data.  Last Pain:  Vitals:   11/13/18 0702  TempSrc: Tympanic  PainSc: 0-No pain      Patients Stated Pain Goal: 0 ([phone] [id number])  Complications: No apparent anesthesia complications

## 2018-11-13 NOTE — H&P (Addendum)
Preoperative History and Physical  Carolyn Cuevas is a 48 y.o. here for surgical management of dysmenorrhea.   No significant preoperative concerns.  TVUS: 09/08/2018 Uterus: 7 x 6 x 6cm, 122mL vol 2cm and 3cm intramural fibroids  EE: 8.37mm LO: 2 x 2 x 2cm RO 3 x 2 x 2cm, 2x 1.5cm cysts   Proposed surgery: robotic assisted total laparoscopic hysterectomy and bilateral salpingectomy  Past Medical History:  Diagnosis Date  . Epilepsy [facility])    Past Surgical History:  Procedure Laterality Date  . APPENDECTOMY    . TOE SURGERY Right    OB History  No obstetric history on file.  Patient denies any other pertinent gynecologic issues.   No current facility-administered medications on file prior to encounter.    Current Outpatient Medications on File Prior to Encounter  Medication Sig Dispense Refill  . amitriptyline (ELAVIL) 25 MG tablet Take 25 mg by mouth at bedtime.    Marland Kitchen CARBAMAZEPINE ER PO Take 600-800 mg by mouth See admin instructions. Take 600 mg by mouth in the morning and 800 mg at night    . Cholecalciferol (VITAMIN D) 125 MCG (5000 UT) CAPS Take 5,000 Units by mouth 2 (two) times daily.    . folic acid (FOLVITE) A999333 MCG tablet Take 1,200 mcg by mouth daily.    Marland Kitchen levETIRAcetam (KEPPRA) 1000 MG tablet Take 1,500 mg by mouth 2 (two) times daily.     Allergies  Allergen Reactions  . Iohexol Hives and Rash         Social History:   reports that she has never smoked. She has never used smokeless tobacco. She reports previous alcohol use. She reports that she does not use drugs.  History reviewed. No pertinent family history.  Review of Systems: Noncontributory  PHYSICAL EXAM: Blood pressure 132/90, pulse 78, temperature 97.8 F (36.6 C), temperature source Tympanic, resp. rate 20, height 5\' 5"  (1.651 m), weight 72.5 kg, last menstrual period 11/07/2018, SpO2 99 %. General appearance - alert, well appearing, and in no distress Chest - clear to auscultation, no  wheezes, rales or rhonchi, symmetric air entry Heart - normal rate and regular rhythm Abdomen - soft, nontender, nondistended, no masses or organomegaly Pelvic - examination not indicated Extremities - peripheral pulses normal, no pedal edema, no clubbing or cyanosis  Labs: Results for orders placed or performed during the hospital encounter of 11/13/18 (from the past 336 hour(s))  Pregnancy, urine POC   Collection Time: 11/13/18  6:21 AM  Result Value Ref Range   Preg Test, Ur NEGATIVE NEGATIVE  ABO/Rh   Collection Time: 11/13/18  6:31 AM  Result Value Ref Range   ABO/RH(D)      A POS Performed at [facility], [address] [place], [place] [address]   Results for orders placed or performed during the hospital encounter of 11/10/18 (from the past 336 hour(s))  Type and screen [place]   Collection Time: 11/10/18  8:06 AM  Result Value Ref Range   ABO/RH(D) A POS    Antibody Screen NEG    Sample Expiration 11/24/2018,2359    Extend sample reason      NO TRANSFUSIONS OR PREGNANCY IN THE PAST 3 MONTHS Performed at [facility], [address] [place], [place] [id number]   Basic metabolic panel   Collection Time: 11/10/18  8:09 AM  Result Value Ref Range   Sodium 139 135 - 145 mmol/L   Potassium 4.4 3.5 - 5.1  mmol/L   Chloride 107 98 - 111 mmol/L   CO2 24 22 - 32 mmol/L   Glucose, Bld 97 70 - 99 mg/dL   BUN 9 6 - 20 mg/dL   Creatinine, Ser 0.55 0.44 - 1.00 mg/dL   Calcium 8.9 8.9 - 10.3 mg/dL   GFR calc non Af Amer >60 >60 mL/min   GFR calc Af Amer >60 >60 mL/min   Anion gap 8 5 - 15  CBC   Collection Time: 11/10/18  8:09 AM  Result Value Ref Range   WBC 3.2 (L) 4.0 - 10.5 K/uL   RBC 3.94 3.87 - 5.11 MIL/uL   Hemoglobin 13.0 12.0 - 15.0 g/dL   HCT 38.9 36.0 - 46.0 %   MCV 98.7 80.0 - 100.0 fL   MCH 33.0 26.0 - 34.0 pg   MCHC 33.4 30.0 - 36.0 g/dL   RDW 12.9 11.5 - 15.5 %   Platelets 223 150 - 400 K/uL   nRBC 0.0 0.0 -  0.2 %  SARS CORONAVIRUS 2 (TAT 6-24 HRS) Nasopharyngeal Nasopharyngeal Swab   Collection Time: 11/10/18  8:22 AM   Specimen: Nasopharyngeal Swab  Result Value Ref Range   SARS Coronavirus 2 NEGATIVE NEGATIVE     Assessment: Patient Active Problem List   Diagnosis Date Noted  . Dysmenorrhea 11/13/2018    Plan: Patient will undergo surgical management with robotic assisted total laparoscopic hysterectomy and bilateral salpingectomy .   The risks of surgery were discussed in detail with the patient including but not limited to: bleeding which may require transfusion or reoperation; infection which may require antibiotics; injury to surrounding organs which may involve bowel, bladder, ureters ; need for additional procedures including laparoscopy or laparotomy; thromboembolic phenomenon, surgical site problems and other postoperative/anesthesia complications. Likelihood of success in alleviating the patient's condition was discussed. Routine postoperative instructions will be reviewed with the patient and her family in detail after surgery.  The patient concurred with the proposed plan, giving informed written consent for the surgery.  Patient has been NPO since last night she will remain NPO for procedure.  Anesthesia and OR aware.  Preoperative prophylactic antibiotics and SCDs ordered on call to the OR.  To OR when ready.  ----- Larey Days, MD, [place] Attending Obstetrician and Gynecologist [facility], Department of [facility]  11/13/2018 7:33 AM

## 2018-11-15 ENCOUNTER — Encounter: Payer: Self-pay | Admitting: Obstetrics & Gynecology

## 2018-11-16 NOTE — Anesthesia Postprocedure Evaluation (Signed)
Anesthesia Post Note  Patient: Carolyn Cuevas  Procedure(s) Performed: XI ROBOTIC ASSISTED STAGE 5 RADICAL HYSTERECTOMY WITH BILATERAL SALPINGECTOMY (Bilateral ) EXTENSIVE LAPAROSCOPIC LYSIS OF ADHESIONS, PERITONEAL STRIPPING  Patient location during evaluation: PACU Anesthesia Type: General Level of consciousness: awake and alert Pain management: pain level controlled Vital Signs Assessment: post-procedure vital signs reviewed and stable Respiratory status: spontaneous breathing, nonlabored ventilation and respiratory function stable Cardiovascular status: blood pressure returned to baseline and stable Postop Assessment: no apparent nausea or vomiting Anesthetic complications: no     Last Vitals:  Vitals:   11/13/18 1622 11/13/18 1645  BP: 116/65 140/77  Pulse: 74 88  Resp: 16 16  Temp: 36.9 C   SpO2: 98% 99%    Last Pain:  Vitals:   11/13/18 1645  TempSrc:   PainSc: [place]

## 2018-11-18 LAB — SURGICAL PATHOLOGY

## 2019-11-17 ENCOUNTER — Other Ambulatory Visit: Payer: Self-pay

## 2019-11-17 ENCOUNTER — Encounter: Payer: Self-pay | Admitting: Emergency Medicine

## 2019-11-17 ENCOUNTER — Ambulatory Visit
Admission: EM | Admit: 2019-11-17 | Discharge: 2019-11-17 | Disposition: A | Discharge status: Home / Self Care | Payer: BC Managed Care – PPO | Attending: Family Medicine | Admitting: Family Medicine

## 2019-11-17 DIAGNOSIS — H6122 Impacted cerumen, left ear: Secondary | ICD-10-CM | POA: Diagnosis not present

## 2019-11-17 NOTE — ED Triage Notes (Signed)
Pt c/o left ear pain and fullness. Started about a week ago. She has decreased hearing also. She used debrox drops.

## 2019-11-17 NOTE — ED Provider Notes (Signed)
[facility]    CSN: [id number] Arrival date & time: 11/17/19  1713      History   Chief Complaint Chief Complaint  Patient presents with  . Otalgia    left   HPI  49 year old female presents with the above complaint.  Patient reports that her symptoms started last week.  She states that she felt like her ear was clogged and that she was having difficulty hearing.  Seem to improve slightly with some irrigation and Debrox.  Has now recurred.  She is now having left ear pain.  Pain 5/10 in severity.  She is concerned she may have an ear infection.  No other symptoms.  No other complaints at this time.  Past Medical History:  Diagnosis Date  . Epilepsy [facility])    Patient Active Problem List   Diagnosis Date Noted  . Dysmenorrhea 11/13/2018   Past Surgical History:  Procedure Laterality Date  . APPENDECTOMY    . LAPAROSCOPIC LYSIS OF ADHESIONS  11/13/2018   Procedure: EXTENSIVE LAPAROSCOPIC LYSIS OF ADHESIONS, PERITONEAL STRIPPING;  Surgeon: Ward, Honor Loh, MD;  Location: [facility] ORS;  Service: Gynecology;;  . ROBOTIC ASSISTED TOTAL HYSTERECTOMY WITH BILATERAL SALPINGO OOPHERECTOMY Bilateral 11/13/2018   Procedure: XI ROBOTIC ASSISTED STAGE 5 RADICAL HYSTERECTOMY WITH BILATERAL SALPINGECTOMY;  Surgeon: Ward, Honor Loh, MD;  Location: [facility] ORS;  Service: Gynecology;  Laterality: Bilateral;  . TOE SURGERY Right    OB History   No obstetric history on file.    Home Medications    Prior to Admission medications   Medication Sig Start Date End Date Taking? Authorizing Provider  candesartan (ATACAND) 16 MG tablet Take 16 mg by mouth daily. 11/09/19  Yes [provider]  Cholecalciferol (VITAMIN D) 125 MCG (5000 UT) CAPS Take 5,000 Units by mouth 2 (two) times daily.   Yes [provider]  folic acid (FOLVITE) 154 MCG tablet Take 1,200 mcg by mouth daily.   Yes [provider]  levETIRAcetam (KEPPRA) 1000 MG tablet Take 1,500 mg by mouth 2 (two)  times daily.   Yes [provider]  amitriptyline (ELAVIL) 25 MG tablet Take 25 mg by mouth at bedtime.  11/17/19  [provider]  CARBAMAZEPINE ER PO Take 600-800 mg by mouth See admin instructions. Take 600 mg by mouth in the morning and 800 mg at night 05/19/18 11/17/19  [provider]    Family History Family History  Problem Relation Age of Onset  . Healthy Mother   . Healthy Father     Social History Social History   Tobacco Use  . Smoking status: Never Smoker  . Smokeless tobacco: Never Used  Vaping Use  . Vaping Use: Never used  Substance Use Topics  . Alcohol use: Not Currently  . Drug use: Never     Allergies   Iohexol   Review of Systems Review of Systems  Constitutional: Negative.   HENT: Positive for ear pain and hearing loss.    Physical Exam Triage Vital Signs ED Triage Vitals  Enc Vitals Group     BP 11/17/19 1730 122/79     Pulse Rate 11/17/19 1730 76     Resp 11/17/19 1730 18     Temp 11/17/19 1730 98.6 F (37 C)     Temp Source 11/17/19 1730 Oral     SpO2 11/17/19 1730 100 %     Weight 11/17/19 1726 159 lb 13.3 oz (72.5 kg)     Height 11/17/19 1726 5\' 5"  (  1.651 m)     Head Circumference --      Peak Flow --      Pain Score 11/17/19 1726 5     Pain Loc --      Pain Edu? --      Excl. in [place]? --    Updated Vital Signs BP 122/79 (BP Location: Left Arm)   Pulse 76   Temp 98.6 F (37 C) (Oral)   Resp 18   Ht 5\' 5"  (1.651 m)   Wt 72.5 kg   LMP 11/07/2018   SpO2 100%   BMI 26.60 kg/m   Visual Acuity Right Eye Distance:   Left Eye Distance:   Bilateral Distance:    Right Eye Near:   Left Eye Near:    Bilateral Near:     Physical Exam Vitals and nursing note reviewed.  Constitutional:      General: She is not in acute distress.    Appearance: Normal appearance. She is not ill-appearing.  HENT:     Head: Normocephalic and atraumatic.     Right Ear: Tympanic membrane normal.     Left Ear: There is  impacted cerumen.  Pulmonary:     Effort: Pulmonary effort is normal. No respiratory distress.  Neurological:     Mental Status: She is alert.  Psychiatric:        Mood and Affect: Mood normal.        Behavior: Behavior normal.    UC Treatments / Results  Labs (all labs ordered are listed, but only abnormal results are displayed) Labs Reviewed - No data to display  EKG   Radiology No results found.  Procedures Procedures (including critical care time)  Medications Ordered in UC Medications - No data to display  Initial Impression / Assessment and Plan / UC Course  I have reviewed the triage vital signs and the nursing notes.  Pertinent labs & imaging results that were available during my care of the patient were reviewed by me and considered in my medical decision making (see chart for details).    49 year old female presents with cerumen impaction.  Successful lavage today.  Over-the-counter Debrox as needed.  Final Clinical Impressions(s) / UC Diagnoses   Final diagnoses:  Impacted cerumen of left ear   Discharge Instructions   None    ED Prescriptions    None     PDMP not reviewed this encounter.   Coral Spikes, [place] 11/17/19 1851

## 2019-12-09 ENCOUNTER — Other Ambulatory Visit: Payer: Self-pay

## 2019-12-09 ENCOUNTER — Ambulatory Visit (INDEPENDENT_AMBULATORY_CARE_PROVIDER_SITE_OTHER): Payer: BC Managed Care – PPO | Admitting: Dermatology

## 2019-12-09 DIAGNOSIS — D2371 Other benign neoplasm of skin of right lower limb, including hip: Secondary | ICD-10-CM | POA: Diagnosis not present

## 2019-12-09 DIAGNOSIS — D225 Melanocytic nevi of trunk: Secondary | ICD-10-CM | POA: Diagnosis not present

## 2019-12-09 DIAGNOSIS — Z1283 Encounter for screening for malignant neoplasm of skin: Secondary | ICD-10-CM | POA: Diagnosis not present

## 2019-12-09 DIAGNOSIS — D229 Melanocytic nevi, unspecified: Secondary | ICD-10-CM

## 2019-12-09 DIAGNOSIS — D1800 Hemangioma unspecified site: Secondary | ICD-10-CM

## 2019-12-09 DIAGNOSIS — D2262 Melanocytic nevi of left upper limb, including shoulder: Secondary | ICD-10-CM

## 2019-12-09 DIAGNOSIS — L821 Other seborrheic keratosis: Secondary | ICD-10-CM | POA: Diagnosis not present

## 2019-12-09 DIAGNOSIS — Z808 Family history of malignant neoplasm of other organs or systems: Secondary | ICD-10-CM

## 2019-12-09 DIAGNOSIS — D692 Other nonthrombocytopenic purpura: Secondary | ICD-10-CM

## 2019-12-09 DIAGNOSIS — D492 Neoplasm of unspecified behavior of bone, soft tissue, and skin: Secondary | ICD-10-CM

## 2019-12-09 DIAGNOSIS — D239 Other benign neoplasm of skin, unspecified: Secondary | ICD-10-CM

## 2019-12-09 DIAGNOSIS — Z86018 Personal history of other benign neoplasm: Secondary | ICD-10-CM

## 2019-12-09 HISTORY — DX: Personal history of other benign neoplasm: Z86.018

## 2019-12-09 NOTE — Progress Notes (Signed)
New Patient Visit  Subjective  Carolyn Cuevas is a 49 y.o. female who presents for the following: growths (breast, ~4-70m, no symptoms), mole (abdomen, >67yrs, ), and mole check (Total body skin exam  no hx of skin ca, Grandmothr with hx of skin ca). The patient presents for Total-Body Skin Exam (TBSE) for skin cancer screening and mole check.  The following portions of the chart were reviewed this encounter and updated as appropriate:  Tobacco  Allergies  Meds  Problems  Med Hx  Surg Hx  Fam Hx     Review of Systems:  No other skin or systemic complaints except as noted in HPI or Assessment and Plan.  Objective  Well appearing patient in no apparent distress; mood and affect are within normal limits.  A full examination was performed including scalp, head, eyes, ears, nose, lips, neck, chest, axillae, abdomen, back, buttocks, bilateral upper extremities, bilateral lower extremities, hands, feet, fingers, toes, fingernails, and toenails. All findings within normal limits unless otherwise noted below.  Objective  R neck at proxomal angle of mandible: 0.5cm waxy pap  Objective  L medial buttocks: 0.6cm irregular brown macule  Objective  L proximal bicep:  0.7cm irregular brown macule  Objective  LUQA: 0.8cm irregular brown macule  Objective  Right Thigh, R pretibial: Firm pink/brown papulenodule with dimple sign.    Assessment & Plan    Melanocytic Nevi - Tan-brown and/or pink-flesh-colored symmetric macules and papules - Benign appearing on exam today - Observation - Call clinic for new or changing moles - Recommend daily use of broad spectrum spf 30+ sunscreen to sun-exposed areas.   Skin cancer screening performed today.  Hemangiomas - Red papules - Discussed benign nature - Observe - Call for any changes  Seborrheic Keratoses - Stuck-on, waxy, tan-brown papules and plaques  - Discussed benign etiology and prognosis. - Observe - Call for any  changes  Purpura - Violaceous macules and patches - Benign - Related to age, sun damage and/or use of blood thinners - Observe - Can use OTC arnica containing moisturizer such as Dermend Bruise Formula if desired - Call for worsening or other concerns  Seborrheic keratosis R neck at proximal angle of mandible Benign appearing, observe. Patient declines treatment today. Discussed if any changes/bleeding RTC  Neoplasm of skin (3) L medial buttocks Epidermal / dermal shaving  Lesion diameter (cm):  0.6 Informed consent: discussed and consent obtained   Timeout: patient name, date of birth, surgical site, and procedure verified   Procedure prep:  Patient was prepped and draped in usual sterile fashion Prep type:  Isopropyl alcohol Anesthesia: the lesion was anesthetized in a standard fashion   Anesthetic:  1% lidocaine w/ epinephrine 1-100,000 buffered w/ 8.4% NaHCO3 Instrument used: flexible razor blade   Hemostasis achieved with: pressure, aluminum chloride and electrodesiccation   Outcome: patient tolerated procedure well   Post-procedure details: sterile dressing applied and wound care instructions given   Dressing type: bandage and bacitracin    Specimen 1 - Surgical pathology Differential Diagnosis: D48.5 Nevus vs Dysplastic Nevus Check Margins: yes 0.6cm irregular brown macule  L proximal bicep  Epidermal / dermal shaving  Lesion diameter (cm):  0.7 Informed consent: discussed and consent obtained   Timeout: patient name, date of birth, surgical site, and procedure verified   Procedure prep:  Patient was prepped and draped in usual sterile fashion Prep type:  Isopropyl alcohol Anesthesia: the lesion was anesthetized in a standard fashion   Anesthetic:  1%  lidocaine w/ epinephrine 1-100,000 buffered w/ 8.4% NaHCO3 Instrument used: flexible razor blade   Hemostasis achieved with: pressure, aluminum chloride and electrodesiccation   Outcome: patient tolerated  procedure well   Post-procedure details: sterile dressing applied and wound care instructions given   Dressing type: bandage and bacitracin    Specimen 2 - Surgical pathology Differential Diagnosis: D48.5 Nevus vs Dysplastic nevus Check Margins: yes 0.7cm irregular brown macule  LUQA  Epidermal / dermal shaving  Lesion diameter (cm):  0.8 Informed consent: discussed and consent obtained   Timeout: patient name, date of birth, surgical site, and procedure verified   Procedure prep:  Patient was prepped and draped in usual sterile fashion Prep type:  Isopropyl alcohol Anesthesia: the lesion was anesthetized in a standard fashion   Anesthetic:  1% lidocaine w/ epinephrine 1-100,000 buffered w/ 8.4% NaHCO3 Instrument used: flexible razor blade   Hemostasis achieved with: pressure, aluminum chloride and electrodesiccation   Outcome: patient tolerated procedure well   Post-procedure details: sterile dressing applied and wound care instructions given   Dressing type: bandage and bacitracin    Specimen 3 - Surgical pathology Differential Diagnosis: D48.5 Nevus vs Dysplastic Nevus Check Margins: yes 0.8cm irregular brown macule  Dermatofibroma Right Thigh, R pretibial Benign appearing, observe  Return in about 1 year (around 12/08/2020) for TBSE.   I, Carolyn Cuevas, RMA, am acting as scribe for Sarina Ser, MD .  Documentation: I have reviewed the above documentation for accuracy and completeness, and I agree with the above.  Sarina Ser, MD

## 2019-12-09 NOTE — Patient Instructions (Signed)
Wound Care Instructions  1. Cleanse wound gently with soap and water once a day then pat dry with clean gauze. Apply a thing coat of Petrolatum (petroleum jelly, "Vaseline") over the wound (unless you have an allergy to this). We recommend that you use a new, sterile tube of Vaseline. Do not pick or remove scabs. Do not remove the yellow or white "healing tissue" from the base of the wound.  2. Cover the wound with fresh, clean, nonstick gauze and secure with paper tape. You may use Band-Aids in place of gauze and tape if the would is small enough, but would recommend trimming much of the tape off as there is often too much. Sometimes Band-Aids can irritate the skin.  3. You should call the office for your biopsy report after 1 week if you have not already been contacted.  4. If you experience any problems, such as abnormal amounts of bleeding, swelling, significant bruising, significant pain, or evidence of infection, please call the office immediately.  5. FOR ADULT SURGERY PATIENTS: If you need something for pain relief you may take 1 extra strength Tylenol (acetaminophen) AND 2 Ibuprofen (200mg  each) together every 4 hours as needed for pain. (do not take these if you are allergic to them or if you have a reason you should not take them.) Typically, you may only need pain medication for 1 to 3 days.     Seborrheic Dermatitis  What is seborrheic dermatitis? Seborrheic (say: seb-oh-ree-ick) dermatitis is a disease that causes flaking of the skin.  It usually affects the scalp.  In teenagers and adults, it is commonly called "dandruff".  In infants, it is referred to as "cradle cap".  Dandruff often appears as scaling on the scalp with or without redness.  On other parts of the body, seborrheic dermatitis tends to produce both redness and scaling.  Other common locations of seborrheic dermatitis include the central face, eyebrows, chest, and the creases of the arms, legs, and groin.  It often  causes the skin to look a little greasy, scaly, or flaky. Seborrheic dermatitis can occur at any age.  It often comes and goes and may to be seasonally related, especially in the Northern climates.  What causes seborrheic dermatitis? The exact cause is not known, though yeast of the Malassezia species may be involved.  This organism is normally present on the skin in small numbers, but sometimes its numbers increase, especially in oily skin.  Treatments that reduce the yeast tend to improve seborrheic dermatitis.  How is seborrheic dermatitis treated? The treatment of seborrheic dermatitis depends on its location on the body and the person's age. Seborrheic dermatitis of the scalp (dandruff) in adults and teenagers is usually treated with a medicated shampoo.  Here is a list of the medications that help, and the over-counter shampoos that contain them:  Salicylic acid (Neutrogena T/Sal, Sebulex, Scalpicin, Denorex Extra Strength)  Zinc pyrithione (Head & Shoulders white bottle, Denorex Daily, DHS Zinc, Pantene Pro-V Pyrithione Zinc)  Selenium sulfide (Head & Shoulders blue bottle, Selsun Blue, Exsel Lotion Shampoo, Glo-Sel)  Yahoo tar (Neutrogena T/Gal, Pentrax, Zetar, Tegrin, Viacom, Therapeutic Denorex)  Ketoconazole (Nizoral)  If you have dandruff, you might start by using one of these shampoos every day until your dandruff is controlled and then keep using it at least twice a week.  Often times your doctor will recommend a rotation of several different medicated shampoos as some will experience a plateau in the effectiveness of any one shampoo.  When you use a dandruff shampoo, rub the shampoo into your wet hair and massage into scalp thoroughly.  Let it stay on your hair and scalp for 5 minutes before rinsing.  If you have involvement in the eyebrows or face, you can lather those areas with the medicated shampoo as well, or use a medicated soap (ZNP-bar, Polytar Soap, SAStid, or sulfur  soap).    If the wash or shampoo alone does not help, your doctor might want you to use a prescription medication once or twice a day.  Leave-in medications for the scalp are best applied by massaging into the scalp immediately after towel drying your hair, but may be applied even if you have not washed your hair.  Seborrheic dermatitis in infants usually clears up by age 98 -29 months.  It may develop in the diaper area where it might be confused with diaper rash.  For milder cases you can try gently brushing out scales with a soft brush.  This is best done immediately after washing with a non-medicated baby shampoo Wynetta Emery and Royce Macadamia, etc.).  Your doctor may recommend a medicated shampoo or a prescription topical medication.

## 2019-12-10 ENCOUNTER — Encounter: Payer: Self-pay | Admitting: Dermatology

## 2019-12-16 ENCOUNTER — Telehealth: Payer: Self-pay

## 2019-12-16 NOTE — Telephone Encounter (Signed)
-----   Message from Ralene Bathe, MD sent at 12/15/2019  7:04 PM EDT ----- Diagnosis 1. Skin , left medial buttocks DYSPLASTIC COMPOUND NEVUS WITH MILD ATYPIA, DEEP MARGIN INVOLVED 2. Skin , left prox bicep DYSPLASTIC JUNCTIONAL LENTIGINOUS NEVUS WITH MODERATE ATYPIA 3. Skin , LUQA MELANOCYTIC NEVUS WITH HYPERPIGMENTATION  1- dysplastic Mild Recheck next visit 2- dysplastic Moderate Recheck next visit 3- benign mole  Keep 1 year appt.

## 2019-12-16 NOTE — Telephone Encounter (Signed)
Patient advised of biopsy results and all questions answered.

## 2020-03-23 ENCOUNTER — Other Ambulatory Visit: Payer: Self-pay | Admitting: Orthopedic Surgery

## 2020-03-23 DIAGNOSIS — M238X2 Other internal derangements of left knee: Secondary | ICD-10-CM

## 2020-04-01 ENCOUNTER — Ambulatory Visit
Admission: RE | Admit: 2020-04-01 | Discharge: 2020-04-01 | Disposition: A | Discharge status: Home / Self Care | Payer: BC Managed Care – PPO | Source: Ambulatory Visit | Attending: Orthopedic Surgery | Admitting: Orthopedic Surgery

## 2020-04-01 ENCOUNTER — Other Ambulatory Visit: Payer: Self-pay

## 2020-04-01 DIAGNOSIS — M238X2 Other internal derangements of left knee: Secondary | ICD-10-CM | POA: Insufficient documentation

## 2020-04-11 HISTORY — PX: BREAST BIOPSY: SHX20

## 2020-04-19 ENCOUNTER — Other Ambulatory Visit: Payer: Self-pay | Admitting: Orthopedic Surgery

## 2020-04-20 ENCOUNTER — Other Ambulatory Visit: Payer: Self-pay

## 2020-04-20 ENCOUNTER — Encounter
Admission: RE | Admit: 2020-04-20 | Discharge: 2020-04-20 | Disposition: A | Discharge status: Home / Self Care | Payer: BC Managed Care – PPO | Source: Ambulatory Visit | Attending: Orthopedic Surgery | Admitting: Orthopedic Surgery

## 2020-04-20 DIAGNOSIS — Z01812 Encounter for preprocedural laboratory examination: Secondary | ICD-10-CM | POA: Diagnosis not present

## 2020-04-20 HISTORY — DX: Headache, unspecified: R51.9

## 2020-04-20 NOTE — Patient Instructions (Addendum)
Your procedure is scheduled on:04-24-20 MONDAY Report to the Registration Desk on the 1st floor of the [facility]-Then proceed to the 2nd floor Surgery Desk in the [place] To find out your arrival time, please call [phone] between 1PM - 3PM on:04-21-20 FRIDAY  REMEMBER: Instructions that are not followed completely may result in serious medical risk, up to and including death; or upon the discretion of your surgeon and anesthesiologist your surgery may need to be rescheduled.  Do not eat food after midnight the night before surgery.  No gum chewing, lozengers or hard candies.  You may however, drink CLEAR liquids up to 2 hours before you are scheduled to arrive for your surgery. Do not drink anything within 2 hours of your scheduled arrival time.  Clear liquids include: - water  - apple juice without pulp - gatorade - black coffee or tea (Do NOT add milk or creamers to the coffee or tea) Do NOT drink anything that is not on this list.  In addition, your doctor has ordered for you to drink the provided  Ensure Pre-Surgery Clear Carbohydrate Drink  Drinking this carbohydrate drink up to two hours before surgery helps to reduce insulin resistance and improve patient outcomes. Please complete drinking 2 hours prior to scheduled arrival time.  TAKE THESE MEDICATIONS THE MORNING OF SURGERY WITH A SIP OF WATER: -CARBATROL (CARBAMAZEPINE) -KEPPRA (LEVETIRACETAM)  One week prior to surgery: Stop Anti-inflammatories (NSAIDS) such as Advil, Aleve, Ibuprofen, Motrin, Naproxen, Naprosyn and Aspirin based products such as Excedrin, Goodys Powder, BC Powder-OK TO TAKE TYLENOL IF NEEDED  Stop ANY OVER THE COUNTER supplements until after surgery-OK TO CONTINUE FOLIC ACID UP UNTIL THE DAY BEFORE YOUR SURGERY  No Alcohol for 24 hours before or after surgery.  No Smoking including e-cigarettes for 24 hours prior to surgery.  No chewable tobacco products for at least 6 hours prior to  surgery.  No nicotine patches on the day of surgery.  Do not use any "recreational" drugs for at least a week prior to your surgery.  Please be advised that the combination of cocaine and anesthesia may have negative outcomes, up to and including death. If you test positive for cocaine, your surgery will be cancelled.  On the morning of surgery brush your teeth with toothpaste and water, you may rinse your mouth with mouthwash if you wish. Do not swallow any toothpaste or mouthwash.  Do not wear jewelry, make-up, hairpins, clips or nail polish.  Do not wear lotions, powders, or perfumes.   Do not shave body from the neck down 48 hours prior to surgery just in case you cut yourself which could leave a site for infection.  Also, freshly shaved skin may become irritated if using the CHG soap.  Contact lenses, hearing aids and dentures may not be worn into surgery.  Do not bring valuables to the hospital. [facility] is not responsible for any missing/lost belongings or valuables.   Use CHG Soap as directed on instruction sheet  Notify your doctor if there is any change in your medical condition (cold, fever, infection).  Wear comfortable clothing (specific to your surgery type) to the hospital.  Plan for stool softeners for home use; pain medications have a tendency to cause constipation. You can also help prevent constipation by eating foods high in fiber such as fruits and vegetables and drinking plenty of fluids as your diet allows.  After surgery, you can help prevent lung complications by doing breathing exercises.  Take  deep breaths and cough every 1-2 hours. Your doctor may order a device called an Incentive Spirometer to help you take deep breaths. When coughing or sneezing, hold a pillow firmly against your incision with both hands. This is called splinting. Doing this helps protect your incision. It also decreases belly discomfort.  If you are being admitted to the hospital  overnight, leave your suitcase in the car. After surgery it may be brought to your room.  If you are being discharged the day of surgery, you will not be allowed to drive home. You will need a responsible adult (18 years or older) to drive you home and stay with you that night.   If you are taking public transportation, you will need to have a responsible adult (18 years or older) with you. Please confirm with your physician that it is acceptable to use public transportation.   Please call the [place] [facility] at [phone] if you have any questions about these instructions.  Surgery Visitation Policy:  Patients undergoing a surgery or procedure may have one family member or support person with them as long as that person is not COVID-19 positive or experiencing its symptoms.  That person may remain in the waiting area during the procedure.  Inpatient Visitation:    Visiting hours are 7 a.m. to 8 p.m. Inpatients will be allowed two visitors daily. The visitors may change each day during the patient's stay. No visitors under the age of 52. Any visitor under the age of 52 must be accompanied by an adult. The visitor must pass COVID-19 screenings, use hand sanitizer when entering and exiting the patients room and wear a mask at all times, including in the patients room. Patients must also wear a mask when staff or their visitor are in the room. Masking is required regardless of vaccination status.

## 2020-04-21 ENCOUNTER — Other Ambulatory Visit
Admission: RE | Admit: 2020-04-21 | Discharge: 2020-04-21 | Disposition: A | Discharge status: Home / Self Care | Payer: BC Managed Care – PPO | Source: Ambulatory Visit | Attending: Orthopedic Surgery | Admitting: Orthopedic Surgery

## 2020-04-21 DIAGNOSIS — Z20822 Contact with and (suspected) exposure to covid-19: Secondary | ICD-10-CM | POA: Insufficient documentation

## 2020-04-21 DIAGNOSIS — Z01812 Encounter for preprocedural laboratory examination: Secondary | ICD-10-CM | POA: Insufficient documentation

## 2020-04-21 DIAGNOSIS — M21962 Unspecified acquired deformity of left lower leg: Secondary | ICD-10-CM | POA: Diagnosis not present

## 2020-04-21 DIAGNOSIS — M21862 Other specified acquired deformities of left lower leg: Secondary | ICD-10-CM | POA: Diagnosis present

## 2020-04-21 LAB — SARS CORONAVIRUS 2 (TAT 6-24 HRS): SARS Coronavirus 2: NEGATIVE

## 2020-04-24 ENCOUNTER — Other Ambulatory Visit: Payer: Self-pay

## 2020-04-24 ENCOUNTER — Encounter: Payer: Self-pay | Admitting: Orthopedic Surgery

## 2020-04-24 ENCOUNTER — Encounter
Admission: RE | Disposition: A | Discharge status: Home / Self Care | Payer: Self-pay | Source: Ambulatory Visit | Attending: Orthopedic Surgery

## 2020-04-24 ENCOUNTER — Ambulatory Visit: Payer: BC Managed Care – PPO | Admitting: Certified Registered"

## 2020-04-24 ENCOUNTER — Ambulatory Visit
Admission: RE | Admit: 2020-04-24 | Discharge: 2020-04-24 | Disposition: A | Discharge status: Home / Self Care | Payer: BC Managed Care – PPO | Source: Ambulatory Visit | Attending: Orthopedic Surgery | Admitting: Orthopedic Surgery

## 2020-04-24 DIAGNOSIS — M21962 Unspecified acquired deformity of left lower leg: Secondary | ICD-10-CM | POA: Insufficient documentation

## 2020-04-24 DIAGNOSIS — Z20822 Contact with and (suspected) exposure to covid-19: Secondary | ICD-10-CM | POA: Insufficient documentation

## 2020-04-24 HISTORY — PX: OSTEOCHONDRAL DEFECT REPAIR/RECONSTRUCTION: SHX6232

## 2020-04-24 SURGERY — APPLICATION, GRAFT, OSTEOCHONDRAL, KNEE
Anesthesia: Choice | Laterality: Left

## 2020-04-24 MED ORDER — MIDAZOLAM HCL 2 MG/2ML IJ SOLN
INTRAMUSCULAR | Status: AC
  Filled 2020-04-24: qty 2

## 2020-04-24 MED ORDER — LACTATED RINGERS IV SOLN: INTRAVENOUS | Status: DC

## 2020-04-24 MED ORDER — PROPOFOL 10 MG/ML IV BOLUS
INTRAVENOUS | Status: AC
  Filled 2020-04-24: qty 20

## 2020-04-24 MED ORDER — ACETAMINOPHEN 500 MG PO TABS: 1000.0000 mg | ORAL_TABLET | Freq: Three times a day (TID) | ORAL | 2 refills | Status: AC

## 2020-04-24 MED ORDER — FENTANYL CITRATE (PF) 100 MCG/2ML IJ SOLN
INTRAMUSCULAR | Status: AC
  Filled 2020-04-24: qty 2

## 2020-04-24 MED ORDER — FAMOTIDINE 20 MG PO TABS
ORAL_TABLET | ORAL | Status: AC
  Administered 2020-04-24: 20 mg via ORAL
  Filled 2020-04-24: qty 1

## 2020-04-24 MED ORDER — FENTANYL CITRATE (PF) 100 MCG/2ML IJ SOLN
INTRAMUSCULAR | Status: DC | PRN
  Administered 2020-04-24 (×5): 25 ug via INTRAVENOUS
  Administered 2020-04-24: 50 ug via INTRAVENOUS
  Administered 2020-04-24: 25 ug via INTRAVENOUS

## 2020-04-24 MED ORDER — LIDOCAINE HCL (PF) 2 % IJ SOLN
INTRAMUSCULAR | Status: AC
  Filled 2020-04-24: qty 5

## 2020-04-24 MED ORDER — CEFAZOLIN SODIUM-DEXTROSE 2-4 GM/100ML-% IV SOLN
INTRAVENOUS | Status: AC
  Filled 2020-04-24: qty 100

## 2020-04-24 MED ORDER — CHLORHEXIDINE GLUCONATE 0.12 % MT SOLN: 15.0000 mL | Freq: Once | OROMUCOSAL | Status: AC

## 2020-04-24 MED ORDER — BUPIVACAINE HCL (PF) 0.5 % IJ SOLN
INTRAMUSCULAR | Status: DC | PRN
  Administered 2020-04-24: 14 mL

## 2020-04-24 MED ORDER — OXYCODONE HCL 5 MG PO TABS
ORAL_TABLET | ORAL | Status: AC
  Filled 2020-04-24: qty 1

## 2020-04-24 MED ORDER — BUPIVACAINE HCL (PF) 0.5 % IJ SOLN
INTRAMUSCULAR | Status: AC
  Filled 2020-04-24: qty 30

## 2020-04-24 MED ORDER — PHENYLEPHRINE HCL (PRESSORS) 10 MG/ML IV SOLN
INTRAVENOUS | Status: DC | PRN
  Administered 2020-04-24 (×3): 100 ug via INTRAVENOUS

## 2020-04-24 MED ORDER — ASPIRIN EC 325 MG PO TBEC: 325.0000 mg | DELAYED_RELEASE_TABLET | Freq: Every day | ORAL | 0 refills | Status: AC

## 2020-04-24 MED ORDER — BUPIVACAINE LIPOSOME 1.3 % IJ SUSP
INTRAMUSCULAR | Status: DC | PRN
  Administered 2020-04-24: 10 mL

## 2020-04-24 MED ORDER — ORAL CARE MOUTH RINSE: 15.0000 mL | Freq: Once | OROMUCOSAL | Status: AC

## 2020-04-24 MED ORDER — LIDOCAINE HCL (CARDIAC) PF 100 MG/5ML IV SOSY
PREFILLED_SYRINGE | INTRAVENOUS | Status: DC | PRN
  Administered 2020-04-24: 80 mg via INTRAVENOUS

## 2020-04-24 MED ORDER — GLYCOPYRROLATE 0.2 MG/ML IJ SOLN
INTRAMUSCULAR | Status: DC | PRN
  Administered 2020-04-24: .2 mg via INTRAVENOUS

## 2020-04-24 MED ORDER — PROPOFOL 10 MG/ML IV BOLUS
INTRAVENOUS | Status: DC | PRN
  Administered 2020-04-24: 200 mg via INTRAVENOUS

## 2020-04-24 MED ORDER — ONDANSETRON HCL 4 MG/2ML IJ SOLN
4.0000 mg | Freq: Once | INTRAMUSCULAR | Status: AC | PRN
  Administered 2020-04-24: 4 mg via INTRAVENOUS

## 2020-04-24 MED ORDER — LIDOCAINE-EPINEPHRINE 1 %-1:100000 IJ SOLN
INTRAMUSCULAR | Status: AC
  Filled 2020-04-24: qty 1

## 2020-04-24 MED ORDER — DEXAMETHASONE SODIUM PHOSPHATE 10 MG/ML IJ SOLN
INTRAMUSCULAR | Status: DC | PRN
  Administered 2020-04-24: 5 mg via INTRAVENOUS

## 2020-04-24 MED ORDER — OXYCODONE HCL 5 MG PO TABS
5.0000 mg | ORAL_TABLET | Freq: Once | ORAL | Status: AC
Start: 2020-04-24 — End: 2020-04-24
  Administered 2020-04-24: 5 mg via ORAL

## 2020-04-24 MED ORDER — ONDANSETRON 4 MG PO TBDP: 4.0000 mg | ORAL_TABLET | Freq: Three times a day (TID) | ORAL | 0 refills | Status: DC | PRN

## 2020-04-24 MED ORDER — FENTANYL CITRATE (PF) 100 MCG/2ML IJ SOLN
INTRAMUSCULAR | Status: AC
  Administered 2020-04-24: 25 ug via INTRAVENOUS
  Filled 2020-04-24: qty 2

## 2020-04-24 MED ORDER — ONDANSETRON HCL 4 MG/2ML IJ SOLN
INTRAMUSCULAR | Status: DC | PRN
  Administered 2020-04-24: 4 mg via INTRAVENOUS

## 2020-04-24 MED ORDER — EPHEDRINE SULFATE 50 MG/ML IJ SOLN
INTRAMUSCULAR | Status: DC | PRN
  Administered 2020-04-24: 5 mg via INTRAVENOUS

## 2020-04-24 MED ORDER — FAMOTIDINE 20 MG PO TABS: 20.0000 mg | ORAL_TABLET | Freq: Once | ORAL | Status: AC

## 2020-04-24 MED ORDER — MIDAZOLAM HCL 2 MG/2ML IJ SOLN
INTRAMUSCULAR | Status: DC | PRN
  Administered 2020-04-24: 2 mg via INTRAVENOUS

## 2020-04-24 MED ORDER — CEFAZOLIN SODIUM-DEXTROSE 2-4 GM/100ML-% IV SOLN
2.0000 g | INTRAVENOUS | Status: AC
  Administered 2020-04-24: 2 g via INTRAVENOUS

## 2020-04-24 MED ORDER — OXYCODONE HCL 5 MG PO TABS: 5.0000 mg | ORAL_TABLET | ORAL | 0 refills | Status: AC | PRN

## 2020-04-24 MED ORDER — CHLORHEXIDINE GLUCONATE 0.12 % MT SOLN
OROMUCOSAL | Status: AC
  Administered 2020-04-24: 15 mL via OROMUCOSAL
  Filled 2020-04-24: qty 15

## 2020-04-24 MED ORDER — ONDANSETRON HCL 4 MG/2ML IJ SOLN
INTRAMUSCULAR | Status: AC
  Filled 2020-04-24: qty 2

## 2020-04-24 MED ORDER — FENTANYL CITRATE (PF) 100 MCG/2ML IJ SOLN
25.0000 ug | INTRAMUSCULAR | Status: DC | PRN
  Administered 2020-04-24 (×3): 25 ug via INTRAVENOUS

## 2020-04-24 SURGICAL SUPPLY — 56 items
ALLOGRAFT PATELLA LEFT MOPS (Tissue) ×2 IMPLANT
APL PRP STRL LF DISP 70% ISPRP (MISCELLANEOUS) ×2
APL SWBSTK 6 STRL LF DISP (MISCELLANEOUS) ×2
APPLICATOR COTTON TIP 6 STRL (MISCELLANEOUS) ×2 IMPLANT
APPLICATOR COTTON TIP 6IN STRL (MISCELLANEOUS) ×4
BLADE AGGRESSIVE PLUS 4.0 (BLADE) ×2 IMPLANT
BLADE OSCILLATING/SAGITTAL (BLADE) ×4
BLADE SURG 15 STRL LF DISP TIS (BLADE) ×1 IMPLANT
BLADE SURG 15 STRL SS (BLADE) ×2
BLADE SW THK.38XMED LNG THN (BLADE) ×2 IMPLANT
CANISTER SUCT 1200ML W/VALVE (MISCELLANEOUS) IMPLANT
CANISTER SUCT 3000ML PPV (MISCELLANEOUS) IMPLANT
CHLORAPREP W/TINT 26 (MISCELLANEOUS) ×4 IMPLANT
COUNTER NEEDLE 20/40 LG (NEEDLE) ×2 IMPLANT
COVER MAYO STAND REUSABLE (DRAPES) ×2 IMPLANT
COVER WAND RF STERILE (DRAPES) ×2 IMPLANT
CUFF TOURN SGL QUICK 24 (TOURNIQUET CUFF) ×2
CUFF TOURN SGL QUICK 30 (TOURNIQUET CUFF)
CUFF TRNQT CYL 24X4X16.5-23 (TOURNIQUET CUFF) ×1 IMPLANT
CUFF TRNQT CYL 30X4X21-28X (TOURNIQUET CUFF) IMPLANT
DRAPE 3/4 80X56 (DRAPES) ×4 IMPLANT
DRAPE C-ARM XRAY 36X54 (DRAPES) IMPLANT
DRAPE C-ARMOR (DRAPES) IMPLANT
DRAPE INCISE IOBAN 66X45 STRL (DRAPES) ×4 IMPLANT
DRSG TEGADERM 2-3/8X2-3/4 SM (GAUZE/BANDAGES/DRESSINGS) ×2 IMPLANT
DRSG TEGADERM 4X4.75 (GAUZE/BANDAGES/DRESSINGS) ×2 IMPLANT
ELECT REM PT RETURN 9FT ADLT (ELECTROSURGICAL) ×2
ELECTRODE REM PT RTRN 9FT ADLT (ELECTROSURGICAL) ×1 IMPLANT
GLOVE SRG 8 PF TXTR STRL LF DI (GLOVE) ×2 IMPLANT
GLOVE SURG SYN 7.5  E (GLOVE) ×6
GLOVE SURG SYN 7.5 E (GLOVE) ×3 IMPLANT
GLOVE SURG UNDER POLY LF SZ8 (GLOVE) ×4
GOWN STRL REUS W/ TWL LRG LVL3 (GOWN DISPOSABLE) ×2 IMPLANT
GOWN STRL REUS W/ TWL XL LVL3 (GOWN DISPOSABLE) ×2 IMPLANT
GOWN STRL REUS W/TWL LRG LVL3 (GOWN DISPOSABLE) ×4
GOWN STRL REUS W/TWL XL LVL3 (GOWN DISPOSABLE) ×4
GRADUATE 1200CC STRL 31836 (MISCELLANEOUS) ×4 IMPLANT
HEMOVAC 400CC 10FR (MISCELLANEOUS) IMPLANT
KIT TURNOVER KIT A (KITS) ×2 IMPLANT
MANIFOLD NEPTUNE II (INSTRUMENTS) ×4 IMPLANT
NDL KEITH SZ2.5 (NEEDLE) ×4 IMPLANT
NEEDLE SPNL 18GX3.5 QUINCKE PK (NEEDLE) ×2 IMPLANT
NS IRRIG 1000ML POUR BTL (IV SOLUTION) ×2 IMPLANT
PACK TOTAL KNEE (MISCELLANEOUS) ×2 IMPLANT
PATTIES SURGICAL .5 X.5 (GAUZE/BANDAGES/DRESSINGS) ×2 IMPLANT
PENCIL SMOKE EVACUATOR (MISCELLANEOUS) ×2 IMPLANT
SPONGE KITTNER 5P (MISCELLANEOUS) ×2 IMPLANT
SUCTION FRAZIER HANDLE 10FR (MISCELLANEOUS) ×2
SUCTION TUBE FRAZIER 10FR DISP (MISCELLANEOUS) ×1 IMPLANT
SUT BONE WAX W31G (SUTURE) ×2 IMPLANT
SUT VIC AB 2-0 CT2 27 (SUTURE) ×2 IMPLANT
SYR 10ML LL (SYRINGE) ×2 IMPLANT
SYR 20ML LL LF (SYRINGE) ×2 IMPLANT
TRAY FOLEY SLVR 16FR LF STAT (SET/KITS/TRAYS/PACK) IMPLANT
WIRE Z .045 C-WIRE SPADE TIP (WIRE) IMPLANT
WIRE Z .062 C-WIRE SPADE TIP (WIRE) IMPLANT

## 2020-04-24 NOTE — Op Note (Addendum)
Operative Note  DATE: 04/24/2020  PRE-OP DIAGNOSIS:  1. Left knee patella chondral defect  POST-OP DIAGNOSES:  1. Left knee patella chondral defect  PROCEDURES: 1. Left knee patella osteochondral allograft (54mm x 1) 2. Left knee arthroscopic chondroplasty of medial tibial plateau  SURGEON:  Cato Mulligan, MD  ASSISTANT(S):  Anitra Lauth, PA, Real Cons, PA-S  ANESTHESIA: Gen  TOTAL IV FLUIDS: see anesthesia record  ESTIMATED BLOOD LOSS: 25cc  TOURNIQUET TIME: 106 minutes.  DRAINS: None  SPECIMENS: None.  IMPLANTS: MTF Osteochondral allograft, 67EL  COMPLICATIONS: None apparent.  INDICATIONS: Carolyn Cuevas is a 50 y.o. female with persistent knee pain for ~2 years that has not responded to conservative management including activity modifications and physical therapy. MRI showed grade 4 chondral defect to the medial patellar facet of the patella with significant underlying bone marrow edema. After discussion of risks, benefits, and alternatives to surgery, the patient elected to proceed with above procedures.   OPERATIVE FINDINGS:    Examination under anesthesia: A careful examination under anesthesia was performed.  Passive range of motion was: Hyperextension: 2.  Extension: 0.  Flexion: 135.  Lachman: normal. Pivot Shift: normal.  Posterior drawer: normal.  Varus stability in full extension: normal.  Varus stability in 30 degrees of flexion: normal.  Valgus stability in full extension: normal.  Valgus stability in 30 degrees of flexion: normal.   Intra-operative findings: A thorough arthroscopic examination of the knee was performed.  The findings are: 1. Suprapatellar pouch: Normal 2. Undersurface of median ridge: extension of medial patellar facet lesion 3. Medial patellar facet: Central area of grade 3-4 degenerative change with 2 full-thickness fissures.  Total defect size measured 12 x 18 mm. 4. Lateral patellar facet: Normal 5. Trochlea: Normal 6.  Lateral gutter/popliteus tendon: Normal 7. Hoffa's fat pad: Inflamed 8. Medial gutter/plica: Normal 9. ACL: Normal 10. PCL: Normal 11. Medial meniscus: Normal 12. Medial compartment cartilage: Normal femoral condyle cartilage.  Grade 1-2 degenerative changes to central tibial plateau 13. Lateral meniscus: Normal 14. Lateral compartment cartilage: Mild softening of tibial plateau. Normal lateral femoral condyle   OPERATIVE REPORT:     I identified Carolyn Cuevas in the pre-operative holding area. I marked the operative knee with my initials. I reviewed the risks and benefits of the proposed surgical intervention and the patient (and/or patient's guardian) wished to proceed. The patient was transferred to the operative suite and placed in the supine position with all bony prominences padded.  Anesthesia was administered. Appropriate IV antibiotics were administered prior to incision. The extremity was then prepped and draped in standard fashion. A time out was performed confirming the correct extremity, correct patient, and correct procedure.    A standard anterolateral portal was established with an 11 blade.   The arthroscope was placed in the anterolateral portal and then into the suprapatellar pouch. Next, the medial portal was established under needle localization.  A diagnostic knee arthroscopy was completed with the above findings.  A chondroplasty of the medial tibial plateau was performed using an oscillating shaver such that there were stable cartilaginous edges.  The patellar chondral defect was identified.  Arthroscopic fluid was removed from the joint.  Next, we turned our attention to the open osteonchondral allograft portion of the procedure. A medial parapatellar arthrotomy was performed. Synovitis was resected. Care was taken to avoid cutting the medial meniscus. The patella was everted and held against the lateral femoral condyle with towel clips superiorly and inferiorly.  There  were 2 fissures of the patella cartilage along the medial patellar facet near the apex that were full-thickness.  There was further grade 3 degenerative changes to the medial patellar facet with a total lesion size measuring 18 mm (superior-inferior) x 12 mm (medial-lateral).  Lesion involved the medial patellar facet with lateral extension to the apex.  Appropriate cartilage could best be removed using an 18 mm allograft. Next, an MTF Lesion Gauge, which had a width of 18 mm, was positioned such that all 3 feet were touching the patellar cartilage. A guidepin was placed through the center of the gauge. The appropriately sized Lesion Reamer was then placed over the guide pin and reamed through sclerotic cancellous bone until the depths of the lesion bled.  A depth gauge was used to measure at 12:00, 3 o'clock, 6 o'clock, and 9 o'clock as well as 1:30, 4:30, 7:30, and 10:30 positions. The appropriately sized dilator was then placed.  The osteoarticular allograft distal femur was then securely placed in the MTF [place], and the area of the donor graft was matched to the contour of the patient's medial femoral condyle. An appropriately sized GraftMaker was placed over the osteoarticular allograft and reamed down through the graft to create an 18 mm allograft plug. The depth measurements were transferred to the graft. The graft was placed into the Graft Forceps, lining up the depth markings with the underside of the forceps jaws. A saw used to remove additional bone to match the depths of the walls of the defect. A rongeur was used to fine-tune the depths of the osteochondral allograft plug. The plug was then pulse-lavaged. The graft was then carefully press fit and tamped flush with surrounding cartilage.  Final positioning had excellent fit medially and laterally with slight recession of allograft superiorly and inferiorly.  This allograft achieved excellent and stable fit.  The knee was taken through a range  of motion and the graft remained in place without any notable impingement.   The tourniquet was let down.  There was no unusual bleeding. The arthrotomy/midline incision was closed with interrupted 0 Vicryl sutures, then closing the skin with interrupted inverted 2-0 Vicryl in subcutaneous tissue and 4-0 Monocryl with Dermabond for the skin. Portal incisions were closed with 3-0 nylon. Xeroform gauze, sterile dressings, Polar Care, and brace locked at 0 degrees were applied to the knee.    Instrument, sponge, and needle counts were correct prior to wound closure and at the conclusion of the case.  Of note, assistance from a PA was essential to performing the surgery.  PA was present for the entire surgery.  PA assisted with patient positioning, retraction, instrumentation, and wound closure. The surgery would have been more difficult and had longer operative time without PA assistance.    DISPOSITION: PACU - hemodynamically stable.  POSTOPERATIVE PLAN: The patient will be discharged home from the PACU. ASA for DVT ppx. Use patella osteochondral allograft rehab protocol. WBAT with brace locked in extension. 0-30 degree passive RoM immediately.  Physical therapy to start on postoperative day #3-4.  Follow-up with me in approximately 2 weeks.

## 2020-04-24 NOTE — Anesthesia Preprocedure Evaluation (Signed)
Anesthesia Evaluation  Patient identified by MRN, date of birth, ID band Patient awake    Reviewed: Allergy & Precautions, H&P , NPO status , Patient's Chart, lab work & pertinent test results, reviewed documented beta blocker date and time   Airway Mallampati: II  TM Distance: >3 FB Neck ROM: full    Dental  (+) Teeth Intact   Pulmonary neg pulmonary ROS,    Pulmonary exam normal        Cardiovascular Exercise Tolerance: Good negative cardio ROS Normal cardiovascular exam Rhythm:regular Rate:Normal     Neuro/Psych  Headaches, Seizures -,  negative psych ROS   GI/Hepatic negative GI ROS, Neg liver ROS,   Endo/Other  negative endocrine ROS  Renal/GU negative Renal ROS  negative genitourinary   Musculoskeletal   Abdominal   Peds  Hematology negative hematology ROS (+)   Anesthesia Other Findings Past Medical History: No date: Epilepsy ([place])     Comment:  LAST SEIZURE IN 2014 No date: Headache Past Surgical History: No date: ABDOMINAL HYSTERECTOMY No date: APPENDECTOMY 2014: BREAST SURGERY; Left 11/13/2018: LAPAROSCOPIC LYSIS OF ADHESIONS     Comment:  Procedure: EXTENSIVE LAPAROSCOPIC LYSIS OF ADHESIONS,               PERITONEAL STRIPPING;  Surgeon: Ward, Honor Loh, MD;                Location: [facility] ORS;  Service: Gynecology;; No date: MOLE REMOVAL 11/13/2018: ROBOTIC ASSISTED TOTAL HYSTERECTOMY WITH BILATERAL  SALPINGO OOPHERECTOMY; Bilateral     Comment:  Procedure: XI ROBOTIC ASSISTED STAGE 5 RADICAL               HYSTERECTOMY WITH BILATERAL SALPINGECTOMY;  Surgeon:               Ward, Honor Loh, MD;  Location: [facility] ORS;  Service:               Gynecology;  Laterality: Bilateral; No date: TOE SURGERY; Right BMI    Body Mass Index: 26.63 kg/m     Reproductive/Obstetrics negative OB ROS                             Anesthesia Physical Anesthesia Plan  ASA: II  Anesthesia  Plan: General ETT   Post-op Pain Management:  Regional for Post-op pain   Induction:   PONV Risk Score and Plan: 4 or greater  Airway Management Planned:   Additional Equipment:   Intra-op Plan:   Post-operative Plan:   Informed Consent: I have reviewed the patients History and Physical, chart, labs and discussed the procedure including the risks, benefits and alternatives for the proposed anesthesia with the patient or authorized representative who has indicated his/her understanding and acceptance.     Dental Advisory Given  Plan Discussed with: CRNA  Anesthesia Plan Comments:         Anesthesia Quick Evaluation

## 2020-04-24 NOTE — H&P (Signed)
Paper H&P to be scanned into permanent record. H&P reviewed. No significant changes noted.  

## 2020-04-24 NOTE — Transfer of Care (Signed)
Immediate Anesthesia Transfer of Care Note  Patient: Carolyn Cuevas  Procedure(s) Performed: Left patella osteochondral allograft implantation, knee arthroscopy and chondroplasty - [name] to Assist (Left )  Patient Location: PACU  Anesthesia Type:General  Level of Consciousness: awake  Airway & Oxygen Therapy: Patient Spontanous Breathing  Post-op Assessment: Report given to RN and Post -op Vital signs reviewed and stable  Post vital signs: Reviewed and stable  Last Vitals:  Vitals Value Taken Time  BP 108/66 04/24/20 1332  Temp    Pulse 95 04/24/20 1335  Resp 17 04/24/20 1335  SpO2 100 % 04/24/20 1335  Vitals shown include unvalidated device data.  Last Pain:  Vitals:   04/24/20 0835  TempSrc: Temporal  PainSc: 0-No pain         Complications: No complications documented.

## 2020-04-24 NOTE — Discharge Instructions (Addendum)
Post-Op Instructions - Osteochondral Allograft to Patella  1. Bracing or crutches: You will be provided with a long brace (from hip to ankle) and crutches at the time of surgery.  2. Ice: You will be provided with a device [facility]) that allows you to ice the affected area effectively.   3. Showering: Incision must remain dry for 5 days. Afterwards, you may shower and gently pat incision dry.  NO submerging wound for 4 weeks.   4. Driving: You will be given specific driving precautions at discharge. Plan on not driving for at least two weeks for left knee surgery, and 4 weeks for right knee surgery if you are restricted due to the brace and knee motion. Please note that you are advised NOT to drive while taking narcotic pain medications as you may be impaired and unsafe to drive.  5. Activity: Weight bearing: WBAT with brace locked in extension. Brace must remain locked in extension at all times unless otherwise instructed. Bending the knee is limited and will be guided by the physical therapist. Elevate knee above heart level as much as possible for one week. Avoid standing more than 5 minutes (consecutively) for the first week. No exercise involving the knee until cleared by the surgeon or physical therapist.  Avoid long distance travel for 4 weeks.  6. Medications: - You have been provided a prescription for narcotic pain medicine. After surgery, take 1-2 narcotic tablets every 4 hours if needed for severe pain. Most patients use narcotics for 2-7 days.  - A prescription for anti-nausea medication will be provided in case the narcotic medicine causes nausea - take 1 tablet every 6 hours only if nauseated.  - Take enteric coated aspirin 325 mg once daily for 4 weeks to prevent blood clots.  -Take tylenol 1000 every 8 hours for pain.  May stop tylenol 3 days after surgery or when you are having minimal pain. -DO NOT TAKE IBUPROFEN, ALEVE or OTHER NSAIDs as they can interfere with bone healing.   -Take Citracal Maximum Strength (calcium citrate + vitamin D), 2 tabs daily.  If you are taking prescription medication for anxiety, depression, insomnia, muscle spasm, chronic pain, or for attention deficit disorder, you are advised that you are at a higher risk of adverse effects with use of narcotics post-op, including narcotic addiction/dependence, depressed breathing, death. If you use non-prescribed substances: alcohol, marijuana, cocaine, heroin, methamphetamines, etc., you are at a higher risk of adverse effects with use of narcotics post-op, including narcotic addiction/dependence, depressed breathing, death. You are advised that taking > 50 morphine milligram equivalents (MME) of narcotic pain medication per day results in twice the risk of overdose or death. For your prescription provided: oxycodone 5 mg - taking more than 6 tablets per day would result in > 50 morphine milligram equivalents (MME) of narcotic pain medication. Be advised that we will prescribe narcotics short-term, for acute post-operative pain, only 3 weeks for major operations such as knee repair/reconstruction surgeries.   7. Bandages: The physical therapist should change the bandages at the first post-op appointment. If needed, the dressing supplies have been provided to you.  8. Physical Therapy: 2 times per week for the first 4 weeks, then 1-2 times per week from weeks 4-8 post-op. Therapy typically starts on post operative Day 3 or 4. You have been provided an order for physical therapy today and should schedule your appointments in advance to avoid delay. The therapist will provide home exercises.  9. Work or School: For  most, but not all procedures, we advise staying out of work or school for at least 1 to 2 weeks in order to recover from the stress of surgery and to allow time for healing and swelling control. For more active jobs, you may need to remain out for longer. Please discuss with Dr. Posey Pronto if you have not  already done so. If you need a work or school note this can be provided.   10. Post-Op Appointments: Your first post-op appointment will be with Dr. Posey Pronto in approximately 2 weeks time. Please double check if this will be at the [facility] facility (Tuesdays and Thursdays) or [place] facility (Wednesdays).    If you find that they have not been scheduled please call the Orthopaedic Appointment front desk at [phone].   AMBULATORY SURGERY  DISCHARGE INSTRUCTIONS   1) The drugs that you were given will stay in your system until tomorrow so for the next 24 hours you should not:  A) Drive an automobile B) Make any legal decisions C) Drink any alcoholic beverage   2) You may resume regular meals tomorrow.  Today it is better to start with liquids and gradually work up to solid foods.  You may eat anything you prefer, but it is better to start with liquids, then soup and crackers, and gradually work up to solid foods.   3) Please notify your doctor immediately if you have any unusual bleeding, trouble breathing, redness and pain at the surgery site, drainage, fever, or pain not relieved by medication.    4) Additional Instructions:        Please contact your physician with any problems or Same Day Surgery at [phone], Monday through Friday 6 am to 4 pm, or [place] [facility] number at [phone].

## 2020-04-24 NOTE — Anesthesia Procedure Notes (Signed)
Procedure Name: Intubation Performed by: Fredderick Phenix, CRNA Pre-anesthesia Checklist: Patient identified, Emergency Drugs available, Suction available and Patient being monitored Patient Re-evaluated:Patient Re-evaluated prior to induction Oxygen Delivery Method: Circle system utilized Preoxygenation: Pre-oxygenation with 100% oxygen Induction Type: IV induction Ventilation: Mask ventilation without difficulty LMA: LMA inserted LMA Size: 4.0 Tube type: Oral Number of attempts: 1 Airway Equipment and Method: Oral airway Placement Confirmation: ETT inserted through vocal cords under direct vision,  positive ETCO2 and breath sounds checked- equal and bilateral Tube secured with: Tape Dental Injury: Teeth and Oropharynx as per pre-operative assessment

## 2020-04-25 ENCOUNTER — Encounter: Payer: Self-pay | Admitting: Orthopedic Surgery

## 2020-04-26 NOTE — Anesthesia Postprocedure Evaluation (Signed)
Anesthesia Post Note  Patient: Carolyn Cuevas  Procedure(s) Performed: Left patella osteochondral allograft implantation, knee arthroscopy and chondroplasty - Carolyn Cuevas to Assist (Left )  Patient location during evaluation: PACU Anesthesia Type: General Level of consciousness: awake and alert Pain management: pain level controlled Vital Signs Assessment: post-procedure vital signs reviewed and stable Respiratory status: spontaneous breathing, nonlabored ventilation, respiratory function stable and patient connected to nasal cannula oxygen Cardiovascular status: blood pressure returned to baseline and stable Postop Assessment: no apparent nausea or vomiting Anesthetic complications: no   No complications documented.   Last Vitals:  Vitals:   04/24/20 1452 04/24/20 1527  BP: 111/63 (!) 107/58  Pulse: 74 72  Resp: 18   Temp:    SpO2: 100% 100%    Last Pain:  Vitals:   04/25/20 0819  TempSrc:   PainSc: Carolyn Cuevas

## 2020-12-11 ENCOUNTER — Other Ambulatory Visit: Payer: Self-pay

## 2020-12-11 ENCOUNTER — Ambulatory Visit (INDEPENDENT_AMBULATORY_CARE_PROVIDER_SITE_OTHER): Payer: BC Managed Care – PPO | Admitting: Dermatology

## 2020-12-11 DIAGNOSIS — D225 Melanocytic nevi of trunk: Secondary | ICD-10-CM | POA: Diagnosis not present

## 2020-12-11 DIAGNOSIS — Z1283 Encounter for screening for malignant neoplasm of skin: Secondary | ICD-10-CM

## 2020-12-11 DIAGNOSIS — D492 Neoplasm of unspecified behavior of bone, soft tissue, and skin: Secondary | ICD-10-CM

## 2020-12-11 DIAGNOSIS — D2272 Melanocytic nevi of left lower limb, including hip: Secondary | ICD-10-CM

## 2020-12-11 DIAGNOSIS — S20161A Insect bite (nonvenomous) of breast, right breast, initial encounter: Secondary | ICD-10-CM | POA: Diagnosis not present

## 2020-12-11 DIAGNOSIS — D1800 Hemangioma unspecified site: Secondary | ICD-10-CM

## 2020-12-11 DIAGNOSIS — D229 Melanocytic nevi, unspecified: Secondary | ICD-10-CM

## 2020-12-11 DIAGNOSIS — Z86018 Personal history of other benign neoplasm: Secondary | ICD-10-CM

## 2020-12-11 DIAGNOSIS — L821 Other seborrheic keratosis: Secondary | ICD-10-CM

## 2020-12-11 DIAGNOSIS — W57XXXA Bitten or stung by nonvenomous insect and other nonvenomous arthropods, initial encounter: Secondary | ICD-10-CM

## 2020-12-11 DIAGNOSIS — L814 Other melanin hyperpigmentation: Secondary | ICD-10-CM

## 2020-12-11 DIAGNOSIS — D239 Other benign neoplasm of skin, unspecified: Secondary | ICD-10-CM

## 2020-12-11 DIAGNOSIS — D2372 Other benign neoplasm of skin of left lower limb, including hip: Secondary | ICD-10-CM

## 2020-12-11 DIAGNOSIS — D2371 Other benign neoplasm of skin of right lower limb, including hip: Secondary | ICD-10-CM | POA: Diagnosis not present

## 2020-12-11 DIAGNOSIS — L578 Other skin changes due to chronic exposure to nonionizing radiation: Secondary | ICD-10-CM

## 2020-12-11 MED ORDER — MOMETASONE FUROATE 0.1 % EX CREA: 1.0000 "application " | TOPICAL_CREAM | Freq: Every day | CUTANEOUS | 2 refills | Status: DC | PRN

## 2020-12-11 NOTE — Patient Instructions (Addendum)
Wound Care Instructions  Cleanse wound gently with soap and water once a day then pat dry with clean gauze. Apply a thing coat of Petrolatum (petroleum jelly, "Vaseline") over the wound (unless you have an allergy to this). We recommend that you use a new, sterile tube of Vaseline. Do not pick or remove scabs. Do not remove the yellow or white "healing tissue" from the base of the wound.  Cover the wound with fresh, clean, nonstick gauze and secure with paper tape. You may use Band-Aids in place of gauze and tape if the would is small enough, but would recommend trimming much of the tape off as there is often too much. Sometimes Band-Aids can irritate the skin.  You should call the office for your biopsy report after 1 week if you have not already been contacted.  If you experience any problems, such as abnormal amounts of bleeding, swelling, significant bruising, significant pain, or evidence of infection, please call the office immediately.  FOR ADULT SURGERY PATIENTS: If you need something for pain relief you may take 1 extra strength Tylenol (acetaminophen) AND 2 Ibuprofen (200mg each) together every 4 hours as needed for pain. (do not take these if you are allergic to them or if you have a reason you should not take them.) Typically, you may only need pain medication for 1 to 3 days.   If you have any questions or concerns for your doctor, please call our main line at [phone] and press option 4 to reach your doctor's medical assistant. If no one answers, please leave a voicemail as directed and we will return your call as soon as possible. Messages left after 4 pm will be answered the following business day.   You may also send us a message via MyChart. We typically respond to MyChart messages within 1-2 business days.  For prescription refills, please ask your pharmacy to contact our office. Our fax number is [phone].  If you have an urgent issue when the clinic is closed that  cannot wait until the next business day, you can page your doctor at the number below.    Please note that while we do our best to be available for urgent issues outside of office hours, we are not available 24/7.   If you have an urgent issue and are unable to reach us, you may choose to seek medical care at your doctor's office, retail clinic, urgent care center, or emergency room.  If you have a medical emergency, please immediately call 911 or go to the emergency department.  Pager Numbers  - Dr. Kowalski: [phone]  - Dr. Moye: [phone]  - Dr. Stewart: [phone]  In the event of inclement weather, please call our main line at [phone] for an update on the status of any delays or closures.  Dermatology Medication Tips: Please keep the boxes that topical medications come in in order to help keep track of the instructions about where and how to use these. Pharmacies typically print the medication instructions only on the boxes and not directly on the medication tubes.   If your medication is too expensive, please contact our office at [phone] option 4 or send us a message through MyChart.   We are unable to tell what your co-pay for medications will be in advance as this is different depending on your insurance coverage. However, we may be able to find a substitute medication at lower cost or fill out paperwork to get insurance to cover a needed   medication.   If a prior authorization is required to get your medication covered by your insurance company, please allow us 1-2 business days to complete this process.  Drug prices often vary depending on where the prescription is filled and some pharmacies may offer cheaper prices.  The website www.goodrx.com contains coupons for medications through different pharmacies. The prices here do not account for what the cost may be with help from insurance (it may be cheaper with your insurance), but the website can give you the  price if you did not use any insurance.  - You can print the associated coupon and take it with your prescription to the pharmacy.  - You may also stop by our office during regular business hours and pick up a GoodRx coupon card.  - If you need your prescription sent electronically to a different pharmacy, notify our office through [place] MyChart or by phone at [phone] option 4.   

## 2020-12-11 NOTE — Progress Notes (Signed)
Follow-Up Visit   Subjective  Carolyn Cuevas is a 50 y.o. female who presents for the following: Annual Exam (Mole check ). Yearly mole check hx of Dysplastic nevus. Check the right chest area bite appeared ~2 weeks ago.  The patient presents for Total-Body Skin Exam (TBSE) for skin cancer screening and mole check.   The following portions of the chart were reviewed this encounter and updated as appropriate:   Tobacco  Allergies  Meds  Problems  Med Hx  Surg Hx  Fam Hx     Review of Systems:  No other skin or systemic complaints except as noted in HPI or Assessment and Plan.  Objective  Well appearing patient in no apparent distress; mood and affect are within normal limits.  A full examination was performed including scalp, head, eyes, ears, nose, lips, neck, chest, axillae, abdomen, back, buttocks, bilateral upper extremities, bilateral lower extremities, hands, feet, fingers, toes, fingernails, and toenails. All findings within normal limits unless otherwise noted below.  left medial buttock 0.3 cm Recurrent brown macule   Right Breast Bite   right thigh, left knee Firm pink/brown papulenodule with dimple sign.   right mid scapular 0.5 cm regular brown macule        left great toe at lateral aspect 0.2 cm regular brown macule         Assessment & Plan  Neoplasm of skin -recurrent.biopsy-proven dysplastic nevus left medial buttock  Epidermal / dermal shaving  Lesion diameter (cm):  0.3 Informed consent: discussed and consent obtained   Timeout: patient name, date of birth, surgical site, and procedure verified   Procedure prep:  Patient was prepped and draped in usual sterile fashion Prep type:  Isopropyl alcohol Anesthesia: the lesion was anesthetized in a standard fashion   Anesthetic:  1% lidocaine w/ epinephrine 1-100,000 buffered w/ 8.4% NaHCO3 Hemostasis achieved with: pressure, aluminum chloride and electrodesiccation   Outcome: patient  tolerated procedure well   Post-procedure details: sterile dressing applied and wound care instructions given   Dressing type: bandage and petrolatum    Specimen 1 - Surgical pathology Differential Diagnosis: R/O recurrent Dysplastic nevus   Check Margins: No  Bug bite without infection, initial encounter Right Breast Start Mometasone cream apply to affected skin qd-bid prn   Related Medications mometasone (ELOCON) 0.1 % cream Apply 1 application topically daily as needed (Rash).  Dermatofibroma right thigh, left knee Benign-appearing.  Observation.  Call clinic for new or changing moles.  Recommend daily use of broad spectrum spf 30+ sunscreen to sun-exposed areas.    Nevus -see photo right mid scapular Benign-appearing.  Observation.  Call clinic for new or changing moles.  Recommend daily use of broad spectrum spf 30+ sunscreen to sun-exposed areas.    Melanocytic nevus of left great medial aspect -see photo left great toe at lateral aspect Benign-appearing.  Observation.  Call clinic for new or changing moles.  Recommend daily use of broad spectrum spf 30+ sunscreen to sun-exposed areas.    Skin cancer screening  Lentigines - Scattered tan macules - Due to sun exposure - Benign-appearing, observe - Recommend daily broad spectrum sunscreen SPF 30+ to sun-exposed areas, reapply every 2 hours as needed. - Call for any changes  Seborrheic Keratoses - Stuck-on, waxy, tan-brown papules and/or plaques  - Benign-appearing - Discussed benign etiology and prognosis. - Observe - Call for any changes  Melanocytic Nevi - Tan-brown and/or pink-flesh-colored symmetric macules and papules - Benign appearing on exam today - Observation -  Call clinic for new or changing moles - Recommend daily use of broad spectrum spf 30+ sunscreen to sun-exposed areas.   Hemangiomas - Red papules - Discussed benign nature - Observe - Call for any changes  Actinic Damage - Chronic  condition, secondary to cumulative UV/sun exposure - diffuse scaly erythematous macules with underlying dyspigmentation - Recommend daily broad spectrum sunscreen SPF 30+ to sun-exposed areas, reapply every 2 hours as needed.  - Staying in the shade or wearing long sleeves, sun glasses (UVA+UVB protection) and wide brim hats (4-inch brim around the entire circumference of the hat) are also recommended for sun protection.  - Call for new or changing lesions.  History of Dysplastic Nevi left proximal bicep 12/09/2019 - No evidence of recurrence today - Recommend regular full body skin exams - Recommend daily broad spectrum sunscreen SPF 30+ to sun-exposed areas, reapply every 2 hours as needed.  - Call if any new or changing lesions are noted between office visits   Skin cancer screening performed today.   Return in about 1 year (around 12/11/2021) for TBSE, hx of Dysplastic nevus .  IMarye Round, CMA, am acting as scribe for Sarina Ser, MD .  Documentation: I have reviewed the above documentation for accuracy and completeness, and I agree with the above.  Sarina Ser, MD

## 2020-12-12 ENCOUNTER — Encounter: Payer: Self-pay | Admitting: Dermatology

## 2020-12-13 ENCOUNTER — Telehealth: Payer: Self-pay

## 2020-12-13 NOTE — Telephone Encounter (Signed)
-----   Message from Ralene Bathe, MD sent at 12/13/2020  5:05 PM EDT ----- Diagnosis Skin , left medial buttock RECURRENT DYSPLASTIC NEVUS, LIMITED MARGINS FREE  Recurrent dysplastic Recheck next visit

## 2020-12-13 NOTE — Telephone Encounter (Signed)
Patient advised of biopsy results. aw 

## 2020-12-13 NOTE — Telephone Encounter (Signed)
Left message for patient to call office for results/hd 

## 2021-05-01 ENCOUNTER — Other Ambulatory Visit: Payer: Self-pay | Admitting: Infectious Diseases

## 2021-05-01 DIAGNOSIS — Z1231 Encounter for screening mammogram for malignant neoplasm of breast: Secondary | ICD-10-CM

## 2021-05-04 ENCOUNTER — Inpatient Hospital Stay
Admission: RE | Admit: 2021-05-04 | Discharge: 2021-05-04 | Disposition: A | Discharge status: Home / Self Care | Payer: Self-pay | Source: Ambulatory Visit | Attending: *Deleted | Admitting: *Deleted

## 2021-05-04 ENCOUNTER — Other Ambulatory Visit: Payer: Self-pay | Admitting: *Deleted

## 2021-05-04 DIAGNOSIS — Z1231 Encounter for screening mammogram for malignant neoplasm of breast: Secondary | ICD-10-CM

## 2021-06-19 ENCOUNTER — Ambulatory Visit
Admission: RE | Admit: 2021-06-19 | Discharge: 2021-06-19 | Disposition: A | Discharge status: Home / Self Care | Payer: BC Managed Care – PPO | Source: Ambulatory Visit | Attending: Infectious Diseases | Admitting: Infectious Diseases

## 2021-06-19 DIAGNOSIS — Z1231 Encounter for screening mammogram for malignant neoplasm of breast: Secondary | ICD-10-CM | POA: Insufficient documentation

## 2021-06-22 ENCOUNTER — Other Ambulatory Visit: Payer: Self-pay | Admitting: Infectious Diseases

## 2021-06-22 DIAGNOSIS — N6489 Other specified disorders of breast: Secondary | ICD-10-CM

## 2021-06-22 DIAGNOSIS — R928 Other abnormal and inconclusive findings on diagnostic imaging of breast: Secondary | ICD-10-CM

## 2021-07-12 ENCOUNTER — Ambulatory Visit
Admission: RE | Admit: 2021-07-12 | Discharge: 2021-07-12 | Disposition: A | Discharge status: Home / Self Care | Payer: BC Managed Care – PPO | Source: Ambulatory Visit | Attending: Infectious Diseases | Admitting: Infectious Diseases

## 2021-07-12 DIAGNOSIS — N6489 Other specified disorders of breast: Secondary | ICD-10-CM | POA: Diagnosis present

## 2021-07-12 DIAGNOSIS — R928 Other abnormal and inconclusive findings on diagnostic imaging of breast: Secondary | ICD-10-CM

## 2021-08-20 ENCOUNTER — Ambulatory Visit (INDEPENDENT_AMBULATORY_CARE_PROVIDER_SITE_OTHER): Payer: BC Managed Care – PPO

## 2021-08-20 ENCOUNTER — Ambulatory Visit
Admission: EM | Admit: 2021-08-20 | Discharge: 2021-08-20 | Disposition: A | Discharge status: Home / Self Care | Payer: BC Managed Care – PPO | Attending: Emergency Medicine | Admitting: Emergency Medicine

## 2021-08-20 DIAGNOSIS — M25562 Pain in left knee: Secondary | ICD-10-CM

## 2021-08-20 DIAGNOSIS — S8392XA Sprain of unspecified site of left knee, initial encounter: Secondary | ICD-10-CM | POA: Diagnosis not present

## 2021-08-20 DIAGNOSIS — M25572 Pain in left ankle and joints of left foot: Secondary | ICD-10-CM

## 2021-08-20 DIAGNOSIS — S93402A Sprain of unspecified ligament of left ankle, initial encounter: Secondary | ICD-10-CM | POA: Diagnosis not present

## 2021-08-20 DIAGNOSIS — W19XXXA Unspecified fall, initial encounter: Secondary | ICD-10-CM | POA: Diagnosis not present

## 2021-08-20 NOTE — ED Triage Notes (Signed)
Patient presents to UC for stumbling on some rocks and over corrected and landed on her left knee.   Patient did have surgery on that knee about a year and a half ago.   Left knee has been swelling -- when she got into her car and pressed the clutch she heard three pops.   Patient reports pain in her left knee and left ankle. She also reports numbness in the top of her foot.

## 2021-08-20 NOTE — ED Provider Notes (Signed)
[facility]    CSN: [id number] Arrival date & time: 08/20/21  1714      History   Chief Complaint No chief complaint on file.   HPI Carolyn Cuevas is a 51 y.o. female.   HPI  51 year old female here for evaluation of left knee and ankle pain.  Patient reports that she was walking and tripped over some concrete at the end of her drive with her right foot and thinks she overcorrected with her left leg.  She states that she did not fall or have any impact.  She does report that when she got into her car and pressed in the clutch immediately following the overcorrection incident she felt 3 pops in the back of her knee.  The following day she woke up and she had swelling to the knee and pain on the inside of her left ankle.  Today when she woke up she has numbness on the top of her foot.  No limitations to range of motion and patient is able to walk and bear weight.  She did have surgery approximately year ago to her left knee.  The knee is still swollen but there is no ecchymosis.  Past Medical History:  Diagnosis Date   Epilepsy ([place])    LAST SEIZURE IN 2014   Headache    History of dysplastic nevus 12/09/2019   left medial buttocks with mild atypia, left proximal bicep with moderate atypia    Patient Active Problem List   Diagnosis Date Noted   Dysmenorrhea 11/13/2018    Past Surgical History:  Procedure Laterality Date   ABDOMINAL HYSTERECTOMY     APPENDECTOMY     BREAST BIOPSY Left 04/2020   Intraductal Papilloma with Apocrine Metaplasia   BREAST BIOPSY Left    BREAST SURGERY Left 2014   LAPAROSCOPIC LYSIS OF ADHESIONS  11/13/2018   Procedure: EXTENSIVE LAPAROSCOPIC LYSIS OF ADHESIONS, PERITONEAL STRIPPING;  Surgeon: Ward, Honor Loh, MD;  Location: [facility] OR[facility];  Service: Gynecology;;   MOLE REMOVAL     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      Home Medications    Prior to Admission medications   Medication Sig Start Date End Date Taking? Authorizing Provider  calcium carbonate (TUMS EX) 750 MG chewable tablet Chew 1,500 mg by mouth daily as needed for heartburn.   Yes [provider]  candesartan (ATACAND) 16 MG tablet Take 16 mg by mouth every morning. FOR HEADACHES (PT DOES NOT HAVE HTN) 11/09/19  Yes [provider]  carbamazepine (CARBATROL) 200 MG 12 hr capsule Take 600-800 mg by mouth See admin instructions. Take 600 mg in the morning and 800 mg at night   Yes [provider]  Cholecalciferol (VITAMIN D) 125 MCG (5000 UT) CAPS Take 5,000 Units by mouth 2 (two) times daily.   Yes [provider]  levETIRAcetam (KEPPRA) 1000 MG tablet Take 1,500 mg by mouth 2 (two) times daily.   Yes [provider]  rizatriptan (MAXALT-MLT) 10 MG disintegrating tablet Take 10 mg by mouth daily as needed for migraine. 02/09/20  Yes [provider]  folic acid (FOLVITE) 774 MCG tablet Take 1,600 mcg by mouth daily.    [provider]  mometasone (ELOCON) 0.1 % cream Apply 1 application topically daily as needed (Rash). 12/11/20   Ralene Bathe, MD  ondansetron (ZOFRAN ODT) 4 MG disintegrating tablet Take 1 tablet (4 mg total) by mouth every 8 (eight) hours as needed for nausea or vomiting. 04/24/20   Leim Fabry, MD  amitriptyline (ELAVIL) 25 MG tablet Take 25 mg by mouth at bedtime.  11/17/19  [provider]    Family History Family History  Problem Relation Age of Onset   Healthy Mother    Healthy Father     Social History Social History   Tobacco Use   Smoking status: Never   Smokeless tobacco: Never  Vaping Use   Vaping Use: Never used  Substance Use Topics   Alcohol use: Not Currently   Drug use: Never     Allergies   Ibuprofen, Contrast media [iodinated contrast media], and Iohexol   Review of Systems Review of Systems  Musculoskeletal:  Positive for arthralgias and joint swelling.  Skin:  Negative for color change.  Neurological:  Positive for numbness. Negative for weakness.  Hematological: Negative.      Physical Exam Triage Vital Signs ED Triage Vitals  Enc Vitals Group     BP      Pulse      Resp      Temp      Temp src      SpO2      Weight      Height      Head Circumference      Peak Flow      Pain Score      Pain Loc      Pain Edu?      Excl. in [place]?    No data found.  Updated Vital Signs BP 133/86 (BP Location: Left Arm)   Pulse 73   Temp 98.4 F (36.9 C) (Oral)   Ht '5\' 5"'$  (1.651 m)   Wt 167 lb (75.8 kg)   LMP 11/07/2018   SpO2 100%   BMI 27.79 kg/m   Visual Acuity Right Eye Distance:   Left Eye Distance:   Bilateral Distance:    Right Eye Near:   Left Eye Near:    Bilateral Near:     Physical Exam Vitals and nursing note reviewed.  Constitutional:      Appearance: Normal appearance. She is not ill-appearing.  HENT:     Head: Normocephalic and atraumatic.  Musculoskeletal:        General: Swelling and tenderness present. No deformity or signs of injury. Normal range of motion.  Skin:    General: Skin is warm and dry.     Capillary Refill: Capillary refill takes less than 2 seconds.     Findings: No bruising or erythema.  Neurological:     General: No focal deficit present.     Mental Status: She is alert and oriented to person, place, and time.     Sensory: No sensory deficit.     Motor: No weakness.   Psychiatric:        Mood and Affect: Mood normal.        Behavior: Behavior normal.        Thought Content: Thought content  normal.        Judgment: Judgment normal.      UC Treatments / Results  Labs (all labs ordered are listed, but only abnormal results are displayed) Labs Reviewed - No data to display  EKG   Radiology DG Ankle Complete Left  Result Date: 08/20/2021 CLINICAL DATA:  Pain after fall. EXAM: LEFT ANKLE COMPLETE - 3+ VIEW COMPARISON:  None Available. FINDINGS: There is no evidence of fracture, dislocation, or joint effusion. Ankle mortise and talar dome are intact. Minimal talonavicular spurring. There is a plantar calcaneal spur. Mild soft tissue edema anteriorly. IMPRESSION: Soft tissue edema without acute fracture or dislocation. Electronically Signed   By: Keith Rake M.D.   On: 08/20/2021 18:55   DG Knee Complete 4 Views Left  Result Date: 08/20/2021 CLINICAL DATA:  Left knee pain after fall.  Swelling. EXAM: LEFT KNEE - COMPLETE 4+ VIEW COMPARISON:  None Available. FINDINGS: No fracture or dislocation. Trace peripheral spurring of the medial tibiofemoral compartment. Suspect subchondral cystic change in the patellofemoral compartment. No erosion or bony destruction. There is a small knee joint effusion. Small patellar tendon enthesophyte. IMPRESSION: 1. No acute fracture or dislocation. 2. Small joint effusion. 3. Mild degenerative change with probable subchondral cyst in the central patella. Electronically Signed   By: Keith Rake M.D.   On: 08/20/2021 18:54    Procedures Procedures (including critical care time)  Medications Ordered in UC Medications - No data to display  Initial Impression / Assessment and Plan / UC Course  I have reviewed the triage vital signs and the nursing notes.  Pertinent labs & imaging results that were available during my care of the patient were reviewed by me and considered in my medical decision making (see chart for  details).  Patient is a very pleasant, nontoxic-appearing 51 year old female here for evaluation of left knee and left ankle pain as outlined in HPI above.  On exam patient's left knee and ankle are normal anatomical alignment.  Left ankle exam: DP and PT pulses in the left foot and ankle are 2+.  Patient has full sensation range of motion of her toes as well as of her ankle joint.  She does have some tenderness with palpation of the posterior aspect of the distal 6 cm of the medial malleolus.  No tenderness to the lateral malleolus, midfoot, plantar fascia, calcaneus, or Achilles.  Left knee exam: Left knee is in normal anatomical alignment.  There is no tenderness with palpation of the patella, medial lateral joint line, tibial tuberosity, or the quadriceps complex.  She does have some mild tenderness with palpation of the popliteal fossa.  No swelling identified.  Patient reports that she feels popping when I passively brought her knee to full extension.  No crepitus palpated on exam.  No pain with varus or valgus stress application.  I will obtain radiographs of the left ankle and left knee.  I suspect that patient has soft tissue injuries to both given the lack of swelling to the ankle and ecchymosis.  The knee is mildly swollen but patient is able to bear weight and has full range of motion.  Radiology impression of left ankle films states that there is no evidence of fracture, dislocation, or joint effusion.  Ankle mortise talar domes are intact.  Soft tissue edema without acute fracture or dislocation.  Radiology impression of left knee films state no fracture or dislocation, small joint effusion, mild degenerative changes with probable subchondral cyst in the central  patella.  I will discharge patient home with a diagnosis of left knee and ankle sprain.  Have her continue rest, ice, compression, and elevation.  She may continue to apply Voltaren gel to her ankle and to her knee 4 times a day  as needed for pain and inflammation.   Final Clinical Impressions(s) / UC Diagnoses   Final diagnoses:  Sprain of left knee, unspecified ligament, initial encounter  Sprain of left ankle, unspecified ligament, initial encounter     Discharge Instructions      Your x-rays today did not demonstrate the presence of any broken bones.  Continue to rest your knee and your ankle as much as possible.  Elevate your left knee and ankle is much as possible as well to help minimize swelling and aid in pain relief.  Apply ice to your knee and ankle for 20 minutes at a time 2-3 times a day to help with swelling and inflammation.  Apply Voltaren gel to both her ankle and her knee 4 times a day to help with inflammation and pain.  If her symptoms not improve I recommend that you follow-up with orthopedics.     ED Prescriptions   None    PDMP not reviewed this encounter.   Margarette Canada, NP 08/20/21 1906

## 2021-08-20 NOTE — Discharge Instructions (Addendum)
Your x-rays today did not demonstrate the presence of any broken bones.  Continue to rest your knee and your ankle as much as possible.  Elevate your left knee and ankle is much as possible as well to help minimize swelling and aid in pain relief.  Apply ice to your knee and ankle for 20 minutes at a time 2-3 times a day to help with swelling and inflammation.  Apply Voltaren gel to both her ankle and her knee 4 times a day to help with inflammation and pain.  If her symptoms not improve I recommend that you follow-up with orthopedics.

## 2021-12-12 ENCOUNTER — Ambulatory Visit: Payer: BC Managed Care – PPO | Admitting: Dermatology

## 2022-04-25 ENCOUNTER — Ambulatory Visit: Payer: BC Managed Care – PPO | Admitting: Dermatology

## 2022-04-25 VITALS — BP 114/80 | HR 74

## 2022-04-25 DIAGNOSIS — L821 Other seborrheic keratosis: Secondary | ICD-10-CM

## 2022-04-25 DIAGNOSIS — Z86018 Personal history of other benign neoplasm: Secondary | ICD-10-CM

## 2022-04-25 DIAGNOSIS — Z1283 Encounter for screening for malignant neoplasm of skin: Secondary | ICD-10-CM

## 2022-04-25 DIAGNOSIS — L2089 Other atopic dermatitis: Secondary | ICD-10-CM

## 2022-04-25 DIAGNOSIS — D2371 Other benign neoplasm of skin of right lower limb, including hip: Secondary | ICD-10-CM | POA: Diagnosis not present

## 2022-04-25 DIAGNOSIS — Z87898 Personal history of other specified conditions: Secondary | ICD-10-CM

## 2022-04-25 DIAGNOSIS — L814 Other melanin hyperpigmentation: Secondary | ICD-10-CM

## 2022-04-25 DIAGNOSIS — L820 Inflamed seborrheic keratosis: Secondary | ICD-10-CM

## 2022-04-25 DIAGNOSIS — L309 Dermatitis, unspecified: Secondary | ICD-10-CM

## 2022-04-25 DIAGNOSIS — Z79899 Other long term (current) drug therapy: Secondary | ICD-10-CM

## 2022-04-25 DIAGNOSIS — D229 Melanocytic nevi, unspecified: Secondary | ICD-10-CM

## 2022-04-25 DIAGNOSIS — D2372 Other benign neoplasm of skin of left lower limb, including hip: Secondary | ICD-10-CM

## 2022-04-25 DIAGNOSIS — L578 Other skin changes due to chronic exposure to nonionizing radiation: Secondary | ICD-10-CM

## 2022-04-25 MED ORDER — MOMETASONE FUROATE 0.1 % EX CREA: TOPICAL_CREAM | CUTANEOUS | 1 refills | Status: AC

## 2022-04-25 NOTE — Progress Notes (Signed)
Follow-Up Visit   Subjective  Carolyn Cuevas is a 52 y.o. female who presents for the following: Annual Exam (1 year tbse, hx of seb derm , itchy red area left lower leg that would like checked. ). The patient presents for Total-Body Skin Exam (TBSE) for skin cancer screening and mole check.  The patient has spots, moles and lesions to be evaluated, some may be new or changing and the patient has concerns that these could be cancer.  The following portions of the chart were reviewed this encounter and updated as appropriate:  Tobacco  Allergies  Meds  Problems  Med Hx  Surg Hx  Fam Hx     Review of Systems: No other skin or systemic complaints except as noted in HPI or Assessment and Plan.  Objective  Well appearing patient in no apparent distress; mood and affect are within normal limits.  A full examination was performed including scalp, head, eyes, ears, nose, lips, neck, chest, axillae, abdomen, back, buttocks, bilateral upper extremities, bilateral lower extremities, hands, feet, fingers, toes, fingernails, and toenails. All findings within normal limits unless otherwise noted below.  left lower pretibia x 1, left top of shoulder x 1, left lateral bicep x 1 (3) Erythematous stuck-on, waxy papule or plaque  left upper quadrant abdomen Central repigmented dot at left upper quadrant abdomen   Assessment & Plan   Atopic dermatitis Legs Start mometasone  apply to aa bid 5 days weekly for itchy rash until clear.   Atopic dermatitis (eczema) is a chronic, relapsing, pruritic condition that can significantly affect quality of life. It is often associated with allergic rhinitis and/or asthma and can require treatment with topical medications, phototherapy, or in severe cases biologic injectable medication (Dupixent; Adbry) or Oral JAK inhibitors.  mometasone (ELOCON) 0.1 % cream - Left Lower Leg - Anterior Apply topically to aa bid 5 days weekly for itchy rash use until  clear  Inflamed seborrheic keratosis (3) left lower pretibia x 1, left top of shoulder x 1, left lateral bicep x 1 Symptomatic, irritating, patient would like treated. Destruction of lesion - left lower pretibia x 1, left top of shoulder x 1, left lateral bicep x 1 Complexity: simple   Destruction method: cryotherapy   Informed consent: discussed and consent obtained   Timeout:  patient name, date of birth, surgical site, and procedure verified Lesion destroyed using liquid nitrogen: Yes   Region frozen until ice ball extended beyond lesion: Yes   Outcome: patient tolerated procedure well with no complications   Post-procedure details: wound care instructions given   Additional details:  Prior to procedure, discussed risks of blister formation, small wound, skin dyspigmentation, or rare scar following cryotherapy. Recommend Vaseline ointment to treated areas while healing  History of Dysplastic Nevi- L medial buttocks; L proximal bicep - No evidence of recurrence today - Recommend regular full body skin exams - Recommend daily broad spectrum sunscreen SPF 30+ to sun-exposed areas, reapply every 2 hours as needed.  - Call if any new or changing lesions are noted between office visits  History of nevus left upper quadrant abdomen Clear. Observe. Refer to path report [id number] Collected 12/09/19 Bx proven melanocytic nevus with hyperpigmentation  Lentigines - Scattered tan macules - Due to sun exposure - Benign-appearing, observe - Recommend daily broad spectrum sunscreen SPF 30+ to sun-exposed areas, reapply every 2 hours as needed. - Call for any changes  Seborrheic Keratoses - Stuck-on, waxy, tan-brown papules and/or plaques  -  Benign-appearing - Discussed benign etiology and prognosis. - Observe - Call for any changes  Melanocytic Nevi - Tan-brown and/or pink-flesh-colored symmetric macules and papules - Benign appearing on exam today - Observation - Call clinic  for new or changing moles - Recommend daily use of broad spectrum spf 30+ sunscreen to sun-exposed areas.   Hemangiomas - Red papules - Discussed benign nature - Observe - Call for any changes  Dermatofibroma Right thigh and left knee - Firm pink/brown papulenodule with dimple sign - Benign appearing - Call for any changes  Actinic Damage - Chronic condition, secondary to cumulative UV/sun exposure - diffuse scaly erythematous macules with underlying dyspigmentation - Recommend daily broad spectrum sunscreen SPF 30+ to sun-exposed areas, reapply every 2 hours as needed.  - Staying in the shade or wearing long sleeves, sun glasses (UVA+UVB protection) and wide brim hats (4-inch brim around the entire circumference of the hat) are also recommended for sun protection.  - Call for new or changing lesions.  Skin cancer screening performed today. Return for 1 year tbse.  IRuthell Rummage, CMA, am acting as scribe for Sarina Ser, MD. Documentation: I have reviewed the above documentation for accuracy and completeness, and I agree with the above.  Sarina Ser, MD

## 2022-04-25 NOTE — Patient Instructions (Addendum)
For Eczema rash  Start mometasone cream - apply topically to affected areas twice daily 5 x weekly for itchy rash. Use until clear.   Topical steroids (such as triamcinolone, fluocinolone, fluocinonide, mometasone, clobetasol, halobetasol, betamethasone, hydrocortisone) can cause thinning and lightening of the skin if they are used for too long in the same area. Your physician has selected the right strength medicine for your problem and area affected on the body. Please use your medication only as directed by your physician to prevent side effects.     Cryotherapy Aftercare  Wash gently with soap and water everyday.   Apply Vaseline and Band-Aid daily until healed.    Seborrheic Keratosis  What causes seborrheic keratoses? Seborrheic keratoses are harmless, common skin growths that first appear during adult life.  As time goes by, more growths appear.  Some people may develop a large number of them.  Seborrheic keratoses appear on both covered and uncovered body parts.  They are not caused by sunlight.  The tendency to develop seborrheic keratoses can be inherited.  They vary in color from skin-colored to gray, brown, or even black.  They can be either smooth or have a rough, warty surface.   Seborrheic keratoses are superficial and look as if they were stuck on the skin.  Under the microscope this type of keratosis looks like layers upon layers of skin.  That is why at times the top layer may seem to fall off, but the rest of the growth remains and re-grows.    Treatment Seborrheic keratoses do not need to be treated, but can easily be removed in the office.  Seborrheic keratoses often cause symptoms when they rub on clothing or jewelry.  Lesions can be in the way of shaving.  If they become inflamed, they can cause itching, soreness, or burning.  Removal of a seborrheic keratosis can be accomplished by freezing, burning, or surgery. If any spot bleeds, scabs, or grows rapidly, please return  to have it checked, as these can be an indication of a skin cancer.   Melanoma ABCDEs  Melanoma is the most dangerous type of skin cancer, and is the leading cause of death from skin disease.  You are more likely to develop melanoma if you: Have light-colored skin, light-colored eyes, or red or blond hair Spend a lot of time in the sun Tan regularly, either outdoors or in a tanning bed Have had blistering sunburns, especially during childhood Have a close family member who has had a melanoma Have atypical moles or large birthmarks  Early detection of melanoma is key since treatment is typically straightforward and cure rates are extremely high if we catch it early.   The first sign of melanoma is often a change in a mole or a new dark spot.  The ABCDE system is a way of remembering the signs of melanoma.  A for asymmetry:  The two halves do not match. B for border:  The edges of the growth are irregular. C for color:  A mixture of colors are present instead of an even brown color. D for diameter:  Melanomas are usually (but not always) greater than 96m - the size of a pencil eraser. E for evolution:  The spot keeps changing in size, shape, and color.  Please check your skin once per month between visits. You can use a small mirror in front and a large mirror behind you to keep an eye on the back side or your body.   If  you see any new or changing lesions before your next follow-up, please call to schedule a visit.  Please continue daily skin protection including broad spectrum sunscreen SPF 30+ to sun-exposed areas, reapplying every 2 hours as needed when you're outdoors.   Staying in the shade or wearing long sleeves, sun glasses (UVA+UVB protection) and wide brim hats (4-inch brim around the entire circumference of the hat) are also recommended for sun protection.    Due to recent changes in healthcare laws, you may see results of your pathology and/or laboratory studies on MyChart  before the doctors have had a chance to review them. We understand that in some cases there may be results that are confusing or concerning to you. Please understand that not all results are received at the same time and often the doctors may need to interpret multiple results in order to provide you with the best plan of care or course of treatment. Therefore, we ask that you please give [place] 2 business days to thoroughly review all your results before contacting the office for clarification. Should we see a critical lab result, you will be contacted sooner.   If You Need Anything After Your Visit  If you have any questions or concerns for your doctor, please call our main line at [phone] and press option 4 to reach your doctor's medical assistant. If no one answers, please leave a voicemail as directed and we will return your call as soon as possible. Messages left after 4 pm will be answered the following business day.   You may also send [place] a message via [place]. We typically respond to MyChart messages within 1-2 business days.  For prescription refills, please ask your pharmacy to contact our office. Our fax number is [phone].  If you have an urgent issue when the clinic is closed that cannot wait until the next business day, you can page your doctor at the number below.    Please note that while we do our best to be available for urgent issues outside of office hours, we are not available 24/7.   If you have an urgent issue and are unable to reach [place], you may choose to seek medical care at your doctor's office, retail clinic, urgent care center, or emergency room.  If you have a medical emergency, please immediately call 911 or go to the emergency department.  Pager Numbers  - Dr. Nehemiah Massed: [phone]  - Dr. Laurence Ferrari: [phone]  - Dr. Nicole Kindred: [phone]  In the event of inclement weather, please call our main line at [phone] for an update on the status of any delays  or closures.  Dermatology Medication Tips: Please keep the boxes that topical medications come in in order to help keep track of the instructions about where and how to use these. Pharmacies typically print the medication instructions only on the boxes and not directly on the medication tubes.   If your medication is too expensive, please contact our office at [phone] option 4 or send [place] a message through [place].   We are unable to tell what your co-pay for medications will be in advance as this is different depending on your insurance coverage. However, we may be able to find a substitute medication at lower cost or fill out paperwork to get insurance to cover a needed medication.   If a prior authorization is required to get your medication covered by your insurance company, please allow [place] 1-2 business days to complete this process.  Drug  prices often vary depending on where the prescription is filled and some pharmacies may offer cheaper prices.  The website www.goodrx.com contains coupons for medications through different pharmacies. The prices here do not account for what the cost may be with help from insurance (it may be cheaper with your insurance), but the website can give you the price if you did not use any insurance.  - You can print the associated coupon and take it with your prescription to the pharmacy.  - You may also stop by our office during regular business hours and pick up a GoodRx coupon card.  - If you need your prescription sent electronically to a different pharmacy, notify our office through [facility] or by phone at [phone] option 4.     Si Usted Necesita Algo Despus de Su Visita  Tambin puede enviarnos un mensaje a travs de Pharmacist, community. Por lo general respondemos a los mensajes de MyChart en el transcurso de 1 a 2 das hbiles.  Para renovar recetas, por favor pida a su farmacia que se ponga en contacto con nuestra oficina. Harland Dingwall de  fax es [place] [phone].  Si tiene un asunto urgente cuando la clnica est cerrada y que no puede esperar hasta el siguiente da hbil, puede llamar/localizar a su doctor(a) al nmero que aparece a continuacin.   Por favor, tenga en cuenta que aunque hacemos todo lo posible para estar disponibles para asuntos urgentes fuera del horario de [place], no estamos disponibles las 24 horas del da, los 7 das de la [place].   Si tiene un problema urgente y no puede comunicarse con nosotros, puede optar por buscar atencin mdica  en el consultorio de su doctor(a), en una clnica privada, en un centro de atencin urgente o en una sala de emergencias.  Si tiene Engineering geologist, por favor llame inmediatamente al 911 o vaya a la sala de emergencias.  Nmeros de bper  - Dr. Nehemiah Massed: [phone]  - Dra. Moye: [phone]  - Dra. Nicole Kindred: [phone]  En caso de inclemencias del [place], por favor llame a Johnsie Kindred principal al [phone] para una actualizacin sobre el [place] de cualquier retraso o cierre.  Consejos para la medicacin en dermatologa: Por favor, guarde las cajas en las que vienen los medicamentos de uso tpico para ayudarle a seguir las instrucciones sobre dnde y cmo usarlos. Las farmacias generalmente imprimen las instrucciones del medicamento slo en las cajas y no directamente en los tubos del [place].   Si su medicamento es muy caro, por favor, pngase en contacto con Zigmund Daniel llamando al [phone] y presione la opcin 4 o envenos un mensaje a travs de Pharmacist, community.   No podemos decirle cul ser su copago por los medicamentos por adelantado ya que esto es diferente dependiendo de la cobertura de su seguro. Sin embargo, es posible que podamos encontrar un medicamento sustituto a Electrical engineer un formulario para que el seguro cubra el medicamento que se considera necesario.   Si se requiere una autorizacin previa para que su compaa de seguros  [place] su medicamento, por favor permtanos de 1 a 2 das hbiles para completar este proceso.  Los precios de los medicamentos varan con frecuencia dependiendo del Environmental consultant de dnde se surte la receta y alguna farmacias pueden ofrecer precios ms baratos.  El sitio web www.goodrx.com tiene cupones para medicamentos de Airline pilot. Los precios aqu no tienen en cuenta lo que podra costar con la ayuda del seguro (puede ser ms barato  con su seguro), pero el sitio web puede darle el precio si no Field seismologist.  - Puede imprimir el cupn correspondiente y llevarlo con su receta a la farmacia.  - Tambin puede pasar por nuestra oficina durante el horario de atencin regular y Charity fundraiser una tarjeta de cupones de GoodRx.  - Si necesita que su receta se enve electrnicamente a una farmacia diferente, informe a nuestra oficina a travs de MyChart de [place] o por telfono llamando al [phone] y presione la opcin 4.

## 2022-04-26 ENCOUNTER — Encounter: Payer: Self-pay | Admitting: Dermatology

## 2022-04-30 ENCOUNTER — Other Ambulatory Visit: Payer: Self-pay | Admitting: Infectious Diseases

## 2022-04-30 DIAGNOSIS — Z1231 Encounter for screening mammogram for malignant neoplasm of breast: Secondary | ICD-10-CM

## 2022-06-24 ENCOUNTER — Ambulatory Visit
Admission: RE | Admit: 2022-06-24 | Discharge: 2022-06-24 | Disposition: A | Discharge status: Home / Self Care | Payer: BC Managed Care – PPO | Source: Ambulatory Visit | Attending: Infectious Diseases | Admitting: Infectious Diseases

## 2022-06-24 DIAGNOSIS — Z1231 Encounter for screening mammogram for malignant neoplasm of breast: Secondary | ICD-10-CM | POA: Diagnosis present

## 2022-08-20 ENCOUNTER — Other Ambulatory Visit: Payer: Self-pay | Admitting: Certified Nurse Midwife

## 2022-08-20 DIAGNOSIS — N644 Mastodynia: Secondary | ICD-10-CM

## 2022-09-04 ENCOUNTER — Ambulatory Visit
Admission: RE | Admit: 2022-09-04 | Discharge: 2022-09-04 | Disposition: A | Discharge status: Home / Self Care | Payer: BC Managed Care – PPO | Source: Ambulatory Visit | Attending: Certified Nurse Midwife | Admitting: Certified Nurse Midwife

## 2022-09-04 DIAGNOSIS — N644 Mastodynia: Secondary | ICD-10-CM

## 2022-11-13 IMAGING — US US BREAST*L* LIMITED INC AXILLA
1 series · 2 of 2 positions shown · non-contrast
Comparison: Previous exam(s).

CLINICAL DATA: Callback for LEFT breast focal asymmetry. History of
biopsy-proven LEFT breast papilloma (heart clip) and additional
remote benign biopsy in 4582.

EXAM:
DIGITAL DIAGNOSTIC UNILATERAL LEFT MAMMOGRAM WITH TOMOSYNTHESIS AND
CAD; ULTRASOUND LEFT BREAST LIMITED
TECHNIQUE: Left digital diagnostic mammography and breast tomosynthesis was
performed. The images were evaluated with computer-aided detection.;
Targeted ultrasound examination of the left breast was performed.

[Series 1: us breast*left* limited inc axilla · 0.06mm/px · 2 of 2 slices shown]
[im 1/2]
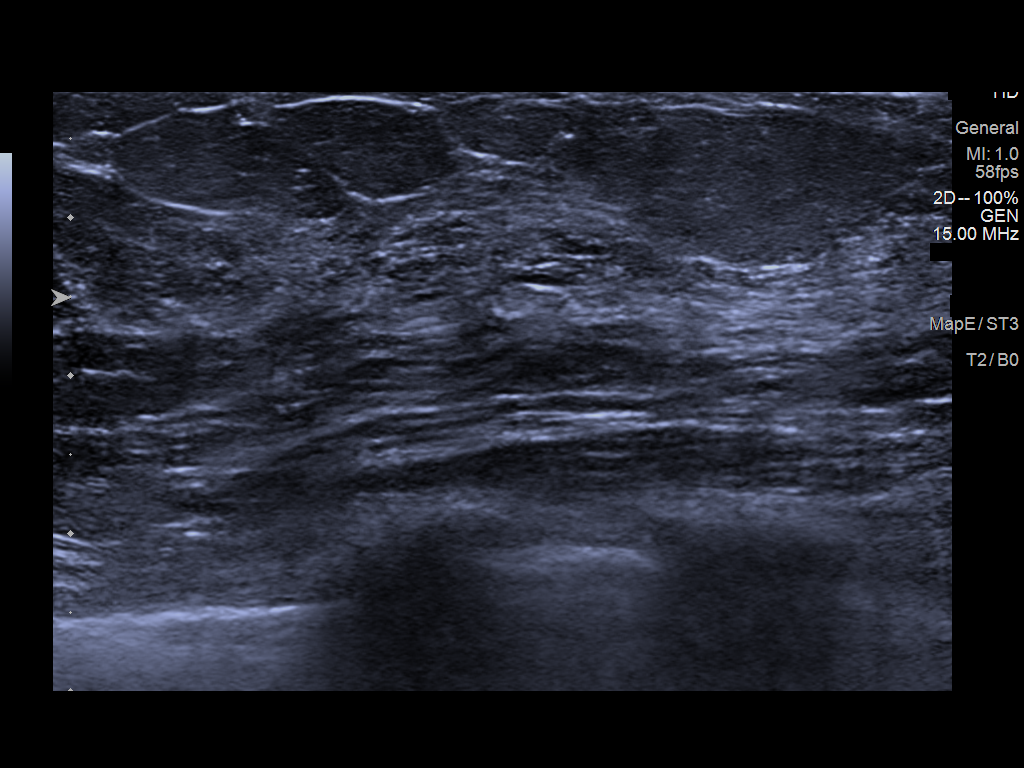
[im 2/2]
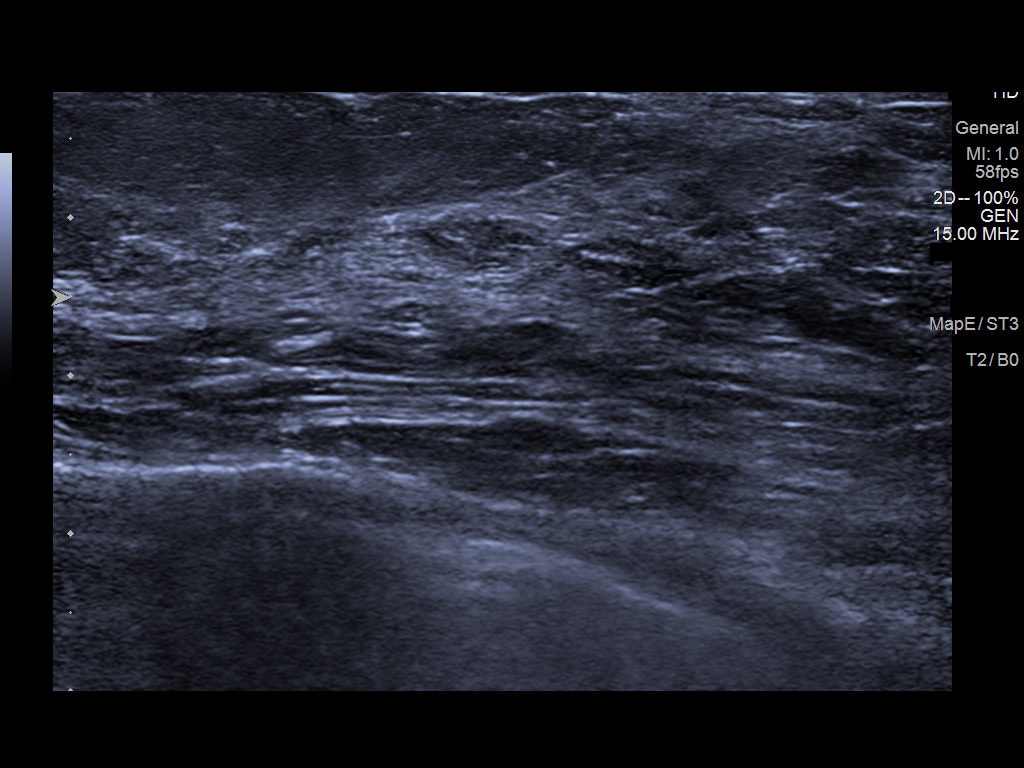

[2 of 2 positions shown; findings below may reference images not displayed]

ACR Breast Density Category c: The breast tissue is heterogeneously
dense, which may obscure small masses.
FINDINGS: The previously described finding does not persist with additional
views, consistent with superimposed fibroglandular tissue.
Fibroglandular tissue assumes a configuration stable in comparison
to prior mammograms. No suspicious mass, microcalcification, or
other finding is identified. Due to breast density, targeted
ultrasound was performed.

On physical exam, no suspicious mass is appreciated.

Targeted ultrasound was performed of the LEFT lower inner breast. No
suspicious cystic or solid mass is seen.
IMPRESSION: No mammographic or sonographic evidence of malignancy at the site of
screening mammographic concern in the LEFT lower inner breast.

RECOMMENDATION:
Screening mammogram in one year.(Code:60-2-IY8)

I have discussed the findings and recommendations with the patient.
If applicable, a reminder letter will be sent to the patient
regarding the next appointment.

BI-RADS CATEGORY  2: Benign.

## 2022-11-13 IMAGING — MG MM DIGITAL DIAGNOSTIC UNILAT*L* W/ TOMO W/ CAD
6 series · 6 of 18 positions shown · non-contrast
Comparison: Previous exam(s).

CLINICAL DATA: Callback for LEFT breast focal asymmetry. History of
biopsy-proven LEFT breast papilloma (heart clip) and additional
remote benign biopsy in 4582.

EXAM:
DIGITAL DIAGNOSTIC UNILATERAL LEFT MAMMOGRAM WITH TOMOSYNTHESIS AND
CAD; ULTRASOUND LEFT BREAST LIMITED
TECHNIQUE: Left digital diagnostic mammography and breast tomosynthesis was
performed. The images were evaluated with computer-aided detection.;
Targeted ultrasound examination of the left breast was performed.

[L CC synth-2D]
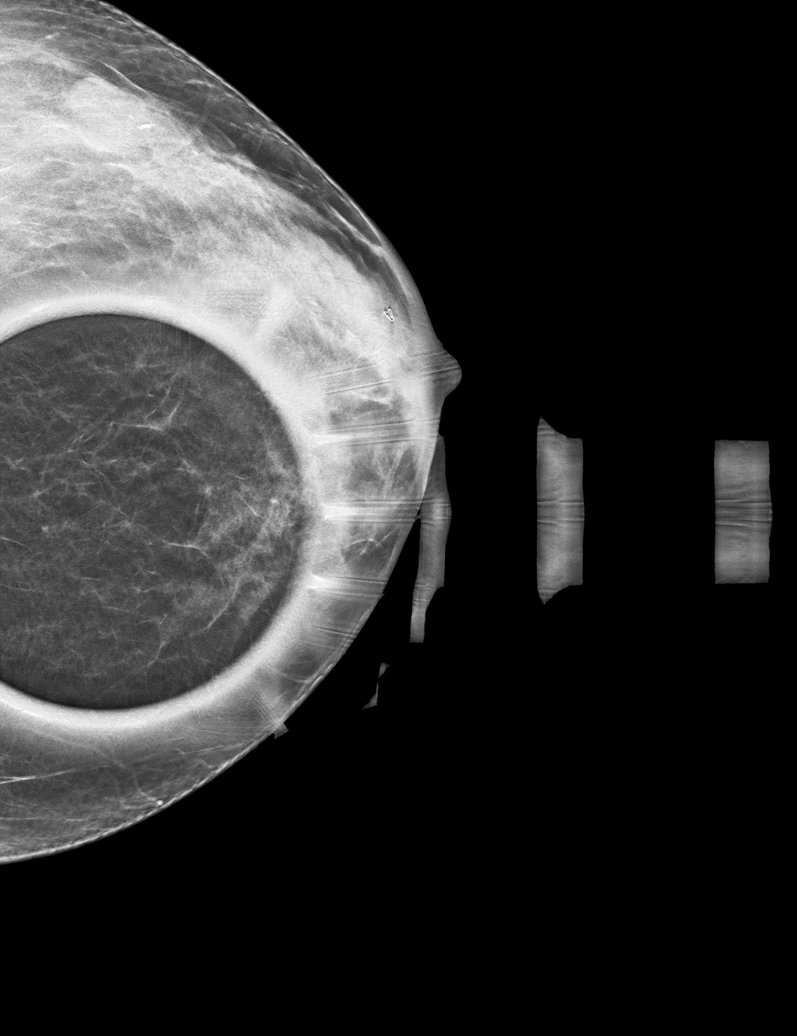

[L ML synth-2D]
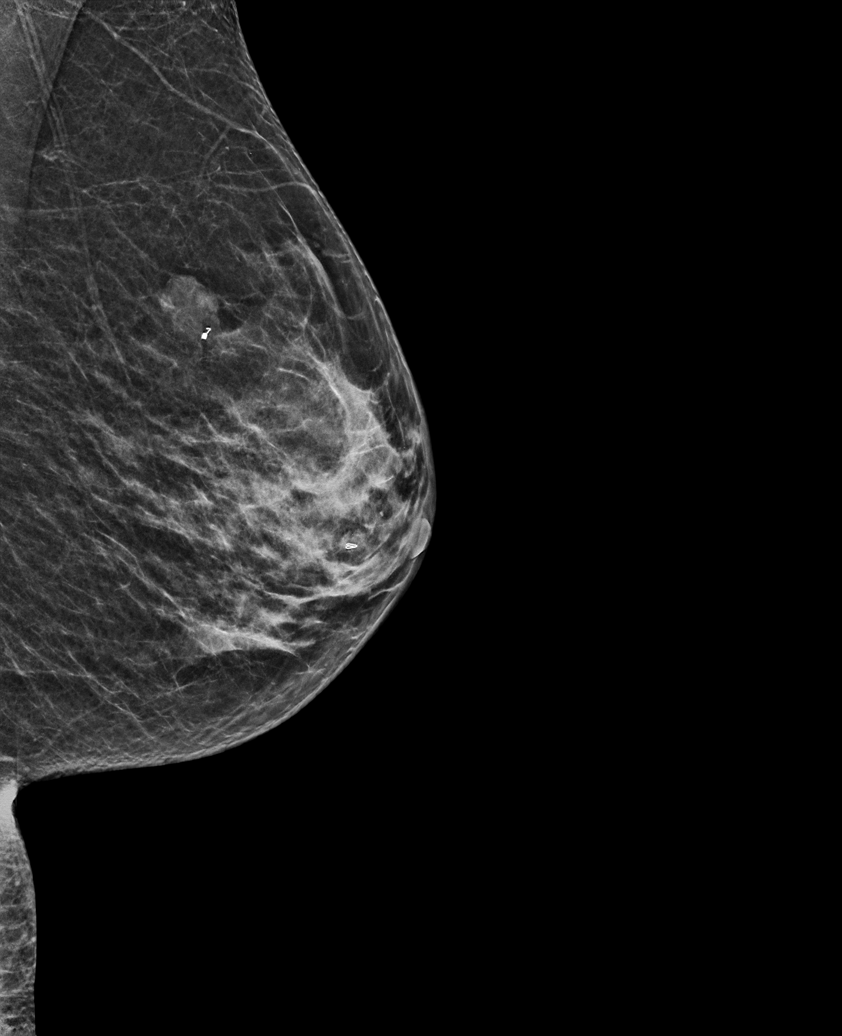

[L MLO synth-2D]
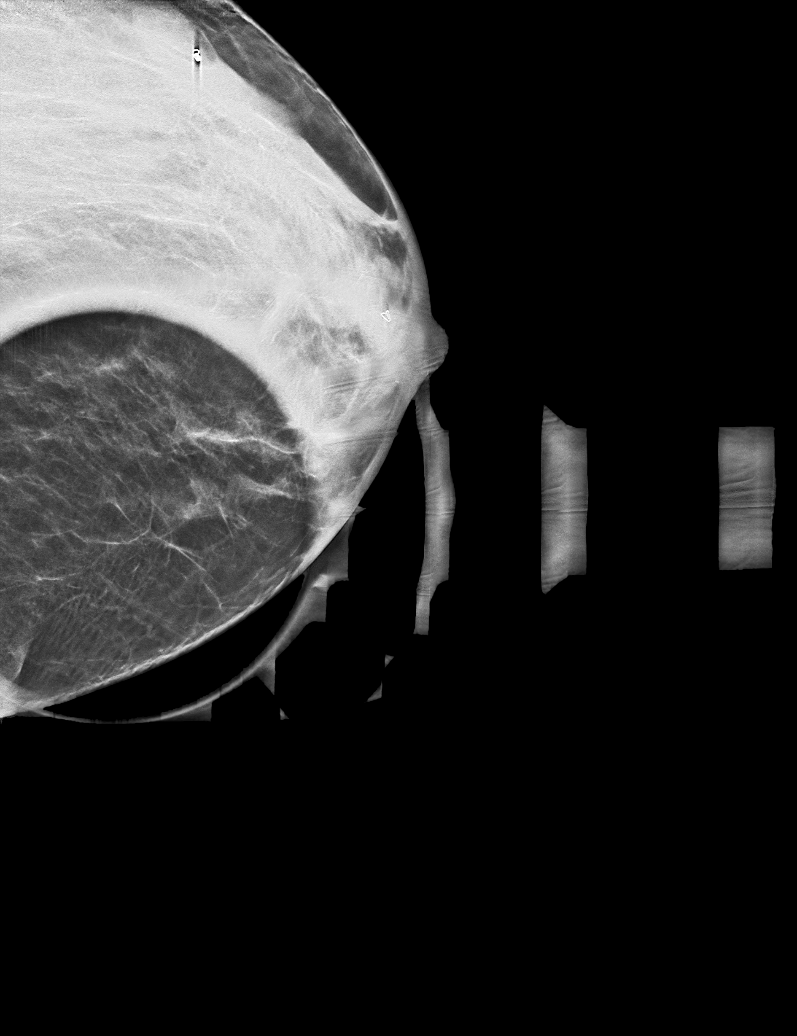

[L ML tomo · tomo slice 29/58.0]
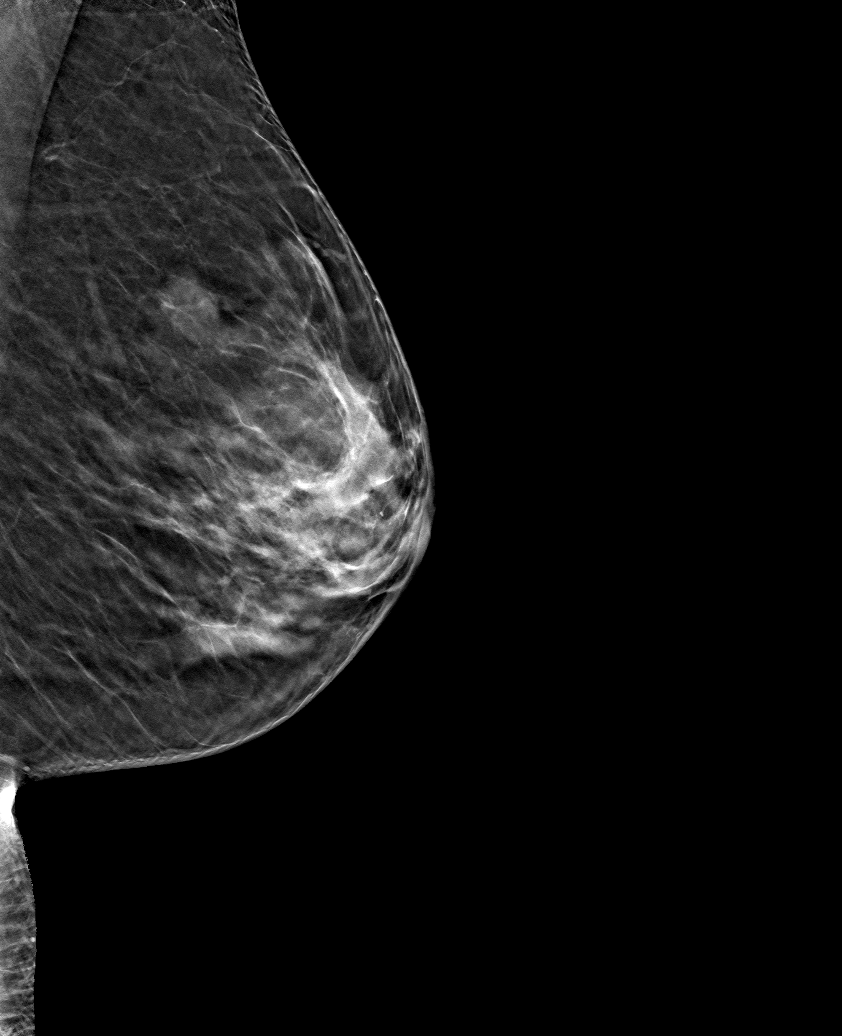

[L MLO tomo · tomo slice 23/44.0]
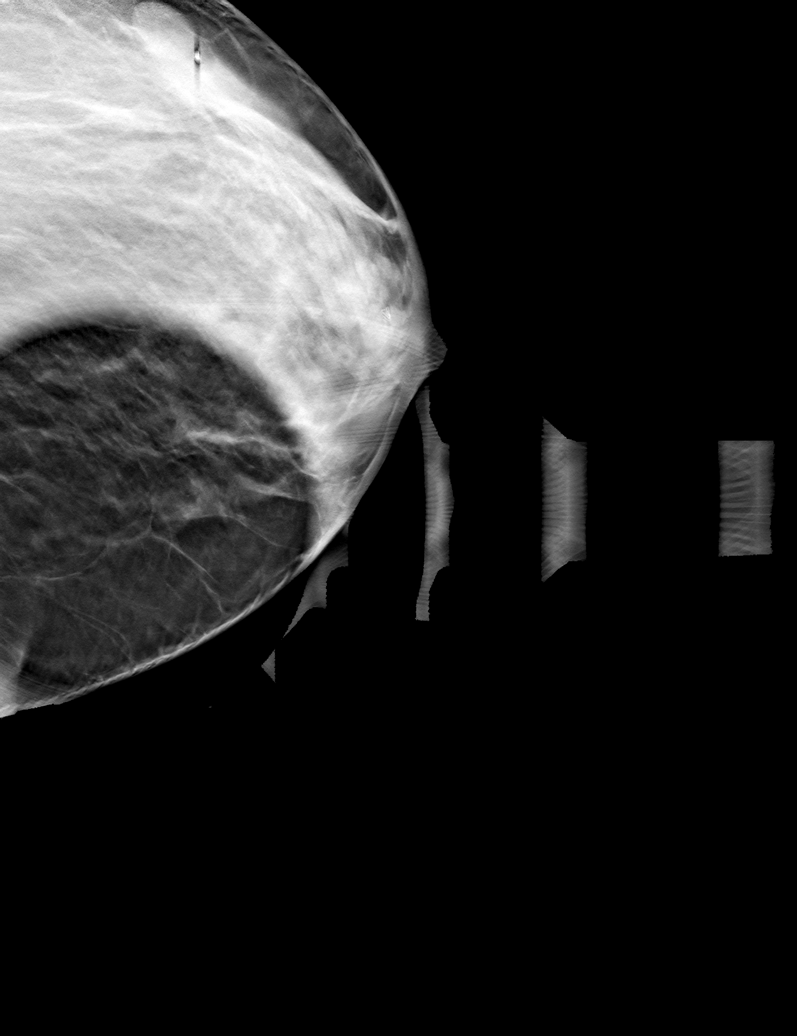

[L CC tomo · tomo slice 23/44.0]
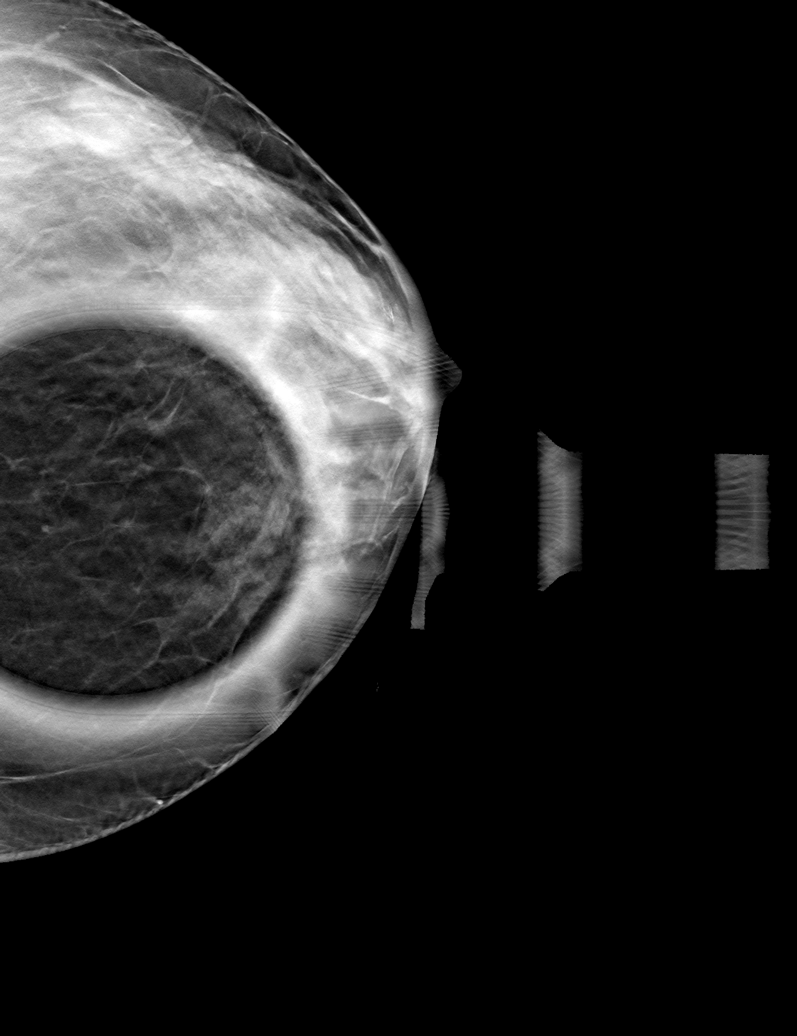

[6 of 18 positions shown; findings below may reference images not displayed]

ACR Breast Density Category c: The breast tissue is heterogeneously
dense, which may obscure small masses.
FINDINGS: The previously described finding does not persist with additional
views, consistent with superimposed fibroglandular tissue.
Fibroglandular tissue assumes a configuration stable in comparison
to prior mammograms. No suspicious mass, microcalcification, or
other finding is identified. Due to breast density, targeted
ultrasound was performed.

On physical exam, no suspicious mass is appreciated.

Targeted ultrasound was performed of the LEFT lower inner breast. No
suspicious cystic or solid mass is seen.
IMPRESSION: No mammographic or sonographic evidence of malignancy at the site of
screening mammographic concern in the LEFT lower inner breast.

RECOMMENDATION:
Screening mammogram in one year.(Code:60-2-IY8)

I have discussed the findings and recommendations with the patient.
If applicable, a reminder letter will be sent to the patient
regarding the next appointment.

BI-RADS CATEGORY  2: Benign.

## 2022-11-26 DIAGNOSIS — R55 Syncope and collapse: Secondary | ICD-10-CM | POA: Insufficient documentation

## 2022-11-27 ENCOUNTER — Other Ambulatory Visit: Payer: Self-pay | Admitting: Internal Medicine

## 2022-11-27 DIAGNOSIS — R55 Syncope and collapse: Secondary | ICD-10-CM

## 2022-12-05 ENCOUNTER — Ambulatory Visit
Admission: RE | Admit: 2022-12-05 | Discharge: 2022-12-05 | Disposition: A | Discharge status: Home / Self Care | Payer: BC Managed Care – PPO | Source: Ambulatory Visit | Attending: Internal Medicine | Admitting: Internal Medicine

## 2022-12-05 DIAGNOSIS — R55 Syncope and collapse: Secondary | ICD-10-CM | POA: Insufficient documentation

## 2023-04-30 ENCOUNTER — Ambulatory Visit: Payer: BC Managed Care – PPO | Admitting: Dermatology

## 2023-05-13 DIAGNOSIS — I447 Left bundle-branch block, unspecified: Secondary | ICD-10-CM | POA: Insufficient documentation

## 2023-06-02 ENCOUNTER — Other Ambulatory Visit: Payer: Self-pay | Admitting: Infectious Diseases

## 2023-06-02 DIAGNOSIS — Z1231 Encounter for screening mammogram for malignant neoplasm of breast: Secondary | ICD-10-CM

## 2023-07-11 ENCOUNTER — Ambulatory Visit
Admission: RE | Admit: 2023-07-11 | Discharge: 2023-07-11 | Disposition: A | Discharge status: Home / Self Care | Source: Ambulatory Visit | Attending: Infectious Diseases | Admitting: Infectious Diseases

## 2023-07-11 DIAGNOSIS — Z1231 Encounter for screening mammogram for malignant neoplasm of breast: Secondary | ICD-10-CM | POA: Insufficient documentation

## 2023-09-01 ENCOUNTER — Ambulatory Visit: Admission: EM | Admit: 2023-09-01 | Discharge: 2023-09-01 | Disposition: A | Discharge status: Home / Self Care

## 2023-09-01 ENCOUNTER — Ambulatory Visit (INDEPENDENT_AMBULATORY_CARE_PROVIDER_SITE_OTHER)

## 2023-09-01 DIAGNOSIS — R102 Pelvic and perineal pain: Secondary | ICD-10-CM | POA: Insufficient documentation

## 2023-09-01 DIAGNOSIS — M25551 Pain in right hip: Secondary | ICD-10-CM | POA: Insufficient documentation

## 2023-09-01 DIAGNOSIS — M545 Low back pain, unspecified: Secondary | ICD-10-CM | POA: Diagnosis not present

## 2023-09-01 LAB — CBC WITH DIFFERENTIAL/PLATELET
Abs Immature Granulocytes: 0.01 K/uL (ref 0.00–0.07)
Basophils Absolute: 0.1 K/uL (ref 0.0–0.1)
Basophils Relative: 2 %
Eosinophils Absolute: 0.1 K/uL (ref 0.0–0.5)
Eosinophils Relative: 4 %
HCT: 36.8 % (ref 36.0–46.0)
Hemoglobin: 12.7 g/dL (ref 12.0–15.0)
Immature Granulocytes: 0 %
Lymphocytes Relative: 31 %
Lymphs Abs: 1.1 K/uL (ref 0.7–4.0)
MCH: 33.1 pg (ref 26.0–34.0)
MCHC: 34.5 g/dL (ref 30.0–36.0)
MCV: 95.8 fL (ref 80.0–100.0)
Monocytes Absolute: 0.4 K/uL (ref 0.1–1.0)
Monocytes Relative: 11 %
Neutro Abs: 1.8 K/uL (ref 1.7–7.7)
Neutrophils Relative %: 52 %
Platelets: 208 K/uL (ref 150–400)
RBC: 3.84 MIL/uL — ABNORMAL LOW (ref 3.87–5.11)
RDW: 14.1 % (ref 11.5–15.5)
WBC: 3.4 K/uL — ABNORMAL LOW (ref 4.0–10.5)
nRBC: 0 % (ref 0.0–0.2)

## 2023-09-01 LAB — COMPREHENSIVE METABOLIC PANEL WITH GFR
ALT: 26 U/L (ref 0–44)
AST: 28 U/L (ref 15–41)
Albumin: 3.6 g/dL (ref 3.5–5.0)
Alkaline Phosphatase: 70 U/L (ref 38–126)
Anion gap: 9 (ref 5–15)
BUN: 12 mg/dL (ref 6–20)
CO2: 24 mmol/L (ref 22–32)
Calcium: 9.3 mg/dL (ref 8.9–10.3)
Chloride: 99 mmol/L (ref 98–111)
Creatinine, Ser: 0.53 mg/dL (ref 0.44–1.00)
GFR, Estimated: >60 mL/min (ref >60)
Glucose, Bld: 86 mg/dL (ref 70–99)
Potassium: 4.6 mmol/L (ref 3.5–5.1)
Sodium: 132 mmol/L — ABNORMAL LOW (ref 135–145)
Total Bilirubin: 0.5 mg/dL (ref 0.0–1.2)
Total Protein: 6.5 g/dL (ref 6.5–8.1)

## 2023-09-01 LAB — URINALYSIS, W/ REFLEX TO CULTURE (INFECTION SUSPECTED)
Bilirubin Urine: NEGATIVE
Glucose, UA: NEGATIVE mg/dL
Hgb urine dipstick: NEGATIVE
Ketones, ur: NEGATIVE mg/dL
Leukocytes,Ua: NEGATIVE
Nitrite: NEGATIVE
Protein, ur: NEGATIVE mg/dL
Specific Gravity, Urine: 1.02 (ref 1.005–1.030)
pH: 6 (ref 5.0–8.0)

## 2023-09-01 MED ORDER — KETOROLAC TROMETHAMINE 30 MG/ML IJ SOLN
30.0000 mg | Freq: Once | INTRAMUSCULAR | Status: AC
  Administered 2023-09-01: 30 mg via INTRAMUSCULAR

## 2023-09-01 NOTE — ED Provider Notes (Signed)
 [facility]    CSN: [id number] Arrival date & time: 09/01/23  1054      History   Chief Complaint Chief Complaint  Patient presents with   Pelvic Pain    HPI Carolyn Cuevas is a 53 y.o. female presenting for constant aching right lower abdominal/pelvic/groin pain that started today, a couple of hours ago. Denies fall or injury. Mild lower back pain. Pain worse with walking and moving hip.   No fever, fatigue, constipation, diarrhea, black or bloody stool, dysuria, urinary urgency/frequency, hematuria, vaginal discharge/odor, or flank pain. Has taken Tylenol  and it did help some.  Patient walks around a track every morning. No change to activity.   Medical history significant for epilepsy, hyponatremia, iron deficiency anemia. Surgical history significant for appendectomy.  Patient had a radical hysterectomy in 2020 for fibroids and endometriosis was diagnosed at that time. Has not had pelvic pain in 5 years since hysterectomy. No history of renal stones.    HPI  Past Medical History:  Diagnosis Date   Epilepsy (HCC)    LAST SEIZURE IN 2014   Headache    History of dysplastic nevus 12/09/2019   left medial buttocks with mild atypia, left proximal bicep with moderate atypia    Patient Active Problem List   Diagnosis Date Noted   Pelvic pain in female 09/01/2023   Left bundle branch block 05/13/2023   Syncope 11/26/2022   Dysmenorrhea 11/13/2018   Elevated blood pressure reading 10/07/2017   Intramural leiomyoma of uterus 07/24/2014   Abnormal mammogram 01/11/2013   Partial epilepsy with impairment of consciousness, intractable (HCC) 09/19/2011   Well adult exam 05/19/2009   Surgery follow-up examination 05/12/2009    Past Surgical History:  Procedure Laterality Date   ABDOMINAL HYSTERECTOMY     APPENDECTOMY     BREAST BIOPSY Left 04/2020   Intraductal Papilloma with Apocrine Metaplasia   BREAST BIOPSY Left    BREAST SURGERY Left 2014    LAPAROSCOPIC LYSIS OF ADHESIONS  11/13/2018   Procedure: EXTENSIVE LAPAROSCOPIC LYSIS OF ADHESIONS, PERITONEAL STRIPPING;  Surgeon: Neomi Mitzie BROCKS, MD;  Location: [facility] ORS;  Service: Gynecology;;   MOLE REMOVAL     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      Home Medications    Prior to Admission medications   Medication Sig Start Date End Date Taking? Authorizing Provider  hydrOXYzine (ATARAX) 25 MG tablet Take 25 mg by mouth 3 (three) times daily as needed for anxiety. 07/29/23  Yes [provider]  calcium carbonate (TUMS EX) 750 MG chewable tablet Chew 1,500 mg by mouth daily as needed for heartburn.    [provider]  candesartan (ATACAND) 16 MG tablet Take 16 mg by mouth every morning. FOR HEADACHES (PT DOES NOT HAVE HTN) 11/09/19   [provider]  carbamazepine (CARBATROL) 200 MG 12 hr capsule Take 600-800 mg by mouth See admin instructions. Take 600 mg in the morning and 800 mg at night  [provider]  Cholecalciferol (VITAMIN D) 125 MCG (5000 UT) CAPS Take 5,000 Units by mouth 2 (two) times daily.    [provider]  ferrous sulfate 325 (65 FE) MG EC tablet Take 1 tablet by mouth every morning.    [provider]  folic acid (FOLVITE) 800 MCG tablet Take 1,600 mcg by mouth daily.    [provider]  levETIRAcetam (KEPPRA) 1000 MG tablet  Take 1,500 mg by mouth 2 (two) times daily.    [provider]  mometasone  (ELOCON ) 0.1 % cream Apply topically to aa bid 5 days weekly for itchy rash use until clear 04/25/22   Hester Alm BROCKS, MD  rizatriptan (MAXALT-MLT) 10 MG disintegrating tablet Take 10 mg by mouth daily as needed for migraine. 02/09/20   [provider]  sertraline (ZOLOFT) 25 MG tablet Take 25 mg by mouth daily.    [provider]  amitriptyline (ELAVIL) 25 MG tablet Take 25 mg by mouth at bedtime.  11/17/19  [provider]    Family History Family History  Problem Relation Age of Onset   Healthy Mother    Healthy Father    Breast cancer Maternal Aunt     Social History Social History   Tobacco Use   Smoking status: Never   Smokeless tobacco: Never  Vaping Use   Vaping status: Never Used  Substance Use Topics   Alcohol use: Not Currently   Drug use: Never     Allergies   Ibuprofen , Contrast media [iodinated contrast media], and Iohexol   Review of Systems Review of Systems  Constitutional:  Negative for appetite change, fatigue and fever.  Gastrointestinal:  Positive for abdominal pain. Negative for abdominal distention, anal bleeding, blood in stool, constipation, diarrhea, nausea, rectal pain and vomiting.  Genitourinary:  Positive for pelvic pain. Negative for difficulty urinating, dysuria, flank pain, frequency, hematuria, urgency, vaginal discharge and vaginal pain.  Musculoskeletal:  Positive for back pain.  Neurological:  Negative for weakness and numbness.     Physical Exam Triage Vital Signs ED Triage Vitals  Encounter Vitals Group     BP --      Girls Systolic BP Percentile --      Girls Diastolic BP Percentile --      Boys Systolic BP Percentile --      Boys Diastolic BP Percentile --      Pulse --      Resp 09/01/23 1104 16     Temp --      Temp Source 09/01/23 1104 Oral     SpO2 --      Weight 09/01/23 1103 180 lb (81.6 kg)     Height  09/01/23 1103 5' 5 (1.651 m)     Head Circumference --      Peak Flow --      Pain Score 09/01/23 1109 7     Pain Loc --      Pain Education --      Exclude from Growth Chart --    No data found.  Updated Vital Signs Resp 16   Ht 5' 5 (1.651 m)   Wt 180 lb (81.6 kg)   LMP 11/07/2018   BMI 29.95 kg/m    Physical Exam Vitals and nursing note reviewed.  Constitutional:      General: She is not in acute distress.    Appearance: Normal appearance. She is not ill-appearing or toxic-appearing.  HENT:     Head: Normocephalic and atraumatic.  Eyes:     General: No scleral icterus.       Right eye: No discharge.        Left eye: No discharge.     Conjunctiva/sclera: Conjunctivae normal.  Cardiovascular:     Rate and Rhythm: Normal rate and regular rhythm.     Heart sounds: Normal heart sounds.  Pulmonary:     Effort: Pulmonary effort is normal. No respiratory distress.     Breath sounds: Normal breath sounds.  Abdominal:     General: There is no distension.     Palpations: Abdomen is soft.     Tenderness: There is abdominal tenderness (RLQ). There is no right CVA tenderness, left CVA tenderness, guarding or rebound.  Musculoskeletal:     Cervical back: Neck supple.     Comments: BACK: There is TTP L5-S1 and right paralumbar muscle, right gluts.   R Hip: No hip tenderness and full ROM of hip. Increased discomfort with internal rotation of hip.  Skin:    General: Skin is dry.  Neurological:     General: No focal deficit present.     Mental Status: She is alert. Mental status is at baseline.     Motor: No weakness.     Gait: Gait normal.  Psychiatric:        Mood and Affect: Mood normal.        Behavior: Behavior normal.      UC Treatments / Results  Labs (all labs ordered are listed, but only abnormal results are displayed) Labs Reviewed - No data to display  EKG   Radiology No results found.  Procedures Procedures (including critical care  time)  Medications Ordered in UC Medications - No data to display  Initial Impression / Assessment and Plan / UC Course  I have reviewed the triage vital signs and the nursing notes.  Pertinent labs & imaging results that were available during my care of the patient were reviewed by me and considered in my medical decision making (see chart for details).   53 y/o female presents for onset of right lower quadrant pain with radiation to right groin and hip a couple hours ago. Also has some back pain. No fever, fatigue, n/v/d, constipation, dysuria/urgency/hematuria, flank pain. Hx of appendectomy and radial hysterectomy for pelvic pain related to fibroids and endometriosis. Has not had pelvic pain since surgery 5 years ago.  Vitals stable and normal. Overall well appearing. On exam, she does have TTP of right lower quadrant without guarding or rebound. No hip tenderness but pain with internal rotation. Tenderness of L5-S1, right paralumbar region and right gluts.  UA is clear.   Ordered CBC, CMP, KUB.  Patient given 30 mg IM ketorolac  for pain relief.   *** Final Clinical Impressions(s) / UC Diagnoses   Final diagnoses:  None   Discharge Instructions   None    ED Prescriptions   None    PDMP not reviewed this encounter.

## 2023-09-01 NOTE — Discharge Instructions (Addendum)
-  Your CBC looks good. Waiting for CMP. Will send results though MyChart. -Xray shows a bit of constipation - Your presentation is most consistent with musculoskeletal problems/pulled muscle.  We gave you an injection of an anti-inflammatory medicine in the clinic.  You can continue Tylenol  at home and rest. - Monitor condition closely and if you are not starting to improve soon, symptoms worsen, you have fever, any new symptoms please consider going to the emergency department for further evaluation likely to include CT scan.  As I explained, I do not have access to CT scan to rule out any other major forms.  If you feel that your pain is severe and worrisome please have a low threshold to go to the ER.

## 2023-09-01 NOTE — ED Triage Notes (Signed)
 Pt c/o R sided pelvic pain x1 day. States pain started suddenly. Denies any injuries. Has tried tylenol  w/o relief.

## 2023-09-02 ENCOUNTER — Ambulatory Visit (HOSPITAL_COMMUNITY): Payer: Self-pay

## 2023-10-21 ENCOUNTER — Encounter: Payer: Self-pay | Admitting: Dermatology

## 2023-10-21 ENCOUNTER — Ambulatory Visit (INDEPENDENT_AMBULATORY_CARE_PROVIDER_SITE_OTHER): Admitting: Dermatology

## 2023-10-21 DIAGNOSIS — D229 Melanocytic nevi, unspecified: Secondary | ICD-10-CM

## 2023-10-21 DIAGNOSIS — L821 Other seborrheic keratosis: Secondary | ICD-10-CM | POA: Diagnosis not present

## 2023-10-21 DIAGNOSIS — L814 Other melanin hyperpigmentation: Secondary | ICD-10-CM

## 2023-10-21 DIAGNOSIS — Z86018 Personal history of other benign neoplasm: Secondary | ICD-10-CM

## 2023-10-21 DIAGNOSIS — L578 Other skin changes due to chronic exposure to nonionizing radiation: Secondary | ICD-10-CM

## 2023-10-21 DIAGNOSIS — Z1283 Encounter for screening for malignant neoplasm of skin: Secondary | ICD-10-CM

## 2023-10-21 DIAGNOSIS — D239 Other benign neoplasm of skin, unspecified: Secondary | ICD-10-CM

## 2023-10-21 DIAGNOSIS — W908XXA Exposure to other nonionizing radiation, initial encounter: Secondary | ICD-10-CM | POA: Diagnosis not present

## 2023-10-21 DIAGNOSIS — L820 Inflamed seborrheic keratosis: Secondary | ICD-10-CM | POA: Diagnosis not present

## 2023-10-21 DIAGNOSIS — L906 Striae atrophicae: Secondary | ICD-10-CM

## 2023-10-21 DIAGNOSIS — D2371 Other benign neoplasm of skin of right lower limb, including hip: Secondary | ICD-10-CM

## 2023-10-21 DIAGNOSIS — D2372 Other benign neoplasm of skin of left lower limb, including hip: Secondary | ICD-10-CM

## 2023-10-21 NOTE — Patient Instructions (Addendum)

## 2023-10-21 NOTE — Progress Notes (Signed)
 Follow-Up Visit   Subjective  Carolyn Cuevas is a 53 y.o. female who presents for the following: Skin Cancer Screening and Full Body Skin Exam hx of dysplastic Nevi, check spot L forehead ~23m, no symptoms, check mole R upper arm  The patient presents for Total-Body Skin Exam (TBSE) for skin cancer screening and mole check. The patient has spots, moles and lesions to be evaluated, some may be new or changing and the patient may have concern these could be cancer.  The following portions of the chart were reviewed this encounter and updated as appropriate: medications, allergies, medical history  Review of Systems:  No other skin or systemic complaints except as noted in HPI or Assessment and Plan.  Objective  Well appearing patient in no apparent distress; mood and affect are within normal limits.  A full examination was performed including scalp, head, eyes, ears, nose, lips, neck, chest, axillae, abdomen, back, buttocks, bilateral upper extremities, bilateral lower extremities, hands, feet, fingers, toes, fingernails, and toenails. All findings within normal limits unless otherwise noted below.   Relevant physical exam findings are noted in the Assessment and Plan.  L upper forehead x 1, R upper arm x 1 (2) Stuck on waxy paps with erythema  Assessment & Plan   SKIN CANCER SCREENING PERFORMED TODAY.  ACTINIC DAMAGE - Chronic condition, secondary to cumulative UV/sun exposure - diffuse scaly erythematous macules with underlying dyspigmentation - Recommend daily broad spectrum sunscreen SPF 30+ to sun-exposed areas, reapply every 2 hours as needed.  - Staying in the shade or wearing long sleeves, sun glasses (UVA+UVB protection) and wide brim hats (4-inch brim around the entire circumference of the hat) are also recommended for sun protection.  - Call for new or changing lesions.  LENTIGINES, SEBORRHEIC KERATOSES, HEMANGIOMAS - Benign normal skin lesions - Benign-appearing - Call  for any changes  MELANOCYTIC NEVI - Tan-brown and/or pink-flesh-colored symmetric macules and papules - Benign appearing on exam today - Observation - Call clinic for new or changing moles - Recommend daily use of broad spectrum spf 30+ sunscreen to sun-exposed areas.   HISTORY OF DYSPLASTIC NEVUS No evidence of recurrence today Recommend regular full body skin exams Recommend daily broad spectrum sunscreen SPF 30+ to sun-exposed areas, reapply every 2 hours as needed.  Call if any new or changing lesions are noted between office visits  - L medial buttocks, L proximal bicep  INFLAMED SEBORRHEIC KERATOSIS (2) L upper forehead x 1, R upper arm x 1 (2) Symptomatic, irritating, patient would like treated. Destruction of lesion - L upper forehead x 1, R upper arm x 1 (2) Complexity: simple   Destruction method: cryotherapy   Informed consent: discussed and consent obtained   Timeout:  patient name, date of birth, surgical site, and procedure verified Lesion destroyed using liquid nitrogen: Yes   Region frozen until ice ball extended beyond lesion: Yes   Outcome: patient tolerated procedure well with no complications   Post-procedure details: wound care instructions given     DERMATOFIBROMA L inferior knee, R pretibial Exam: Firm pink/brown papulenodules with dimple sign L inf knee, R pretibial Treatment Plan: A dermatofibroma is a benign growth possibly related to trauma, such as an insect bite, cut from shaving, or inflamed acne-type bump.  Treatment options to remove include shave or excision with resulting scar and risk of recurrence.  Since benign-appearing and not bothersome, will observe for now.    STRIAE abdomen Exam: abdomen with striae Treatment Plan: Benign-appearing.  Observation.  Call clinic for new or changing moles.  Recommend daily use of broad spectrum spf 30+ sunscreen to sun-exposed areas.   Return for 1-2 years TBSE, Hx of Dysplastic nevi.  I, Grayce Saunas, RMA, am acting as scribe for Alm Rhyme, MD .   Documentation: I have reviewed the above documentation for accuracy and completeness, and I agree with the above.  Alm Rhyme, MD
